# Patient Record
Sex: Male | Born: 1965 | Race: White | Hispanic: No | Smoking: Never smoker
Health system: Southern US, Community
[De-identification: ages and names within clinical notes are randomized; demographics above are authoritative.]

## PROBLEM LIST (undated history)

## (undated) DIAGNOSIS — F419 Anxiety disorder, unspecified: Secondary | ICD-10-CM

## (undated) DIAGNOSIS — K219 Gastro-esophageal reflux disease without esophagitis: Secondary | ICD-10-CM

## (undated) DIAGNOSIS — K760 Fatty (change of) liver, not elsewhere classified: Secondary | ICD-10-CM

## (undated) DIAGNOSIS — E8881 Metabolic syndrome: Secondary | ICD-10-CM

## (undated) DIAGNOSIS — G4733 Obstructive sleep apnea (adult) (pediatric): Secondary | ICD-10-CM

## (undated) DIAGNOSIS — E785 Hyperlipidemia, unspecified: Secondary | ICD-10-CM

## (undated) HISTORY — DX: Fatty (change of) liver, not elsewhere classified: K76.0

## (undated) HISTORY — DX: Hyperlipidemia, unspecified: E78.5

## (undated) HISTORY — DX: Metabolic syndrome: E88.81

## (undated) HISTORY — DX: Gastro-esophageal reflux disease without esophagitis: K21.9

## (undated) HISTORY — DX: Obstructive sleep apnea (adult) (pediatric): G47.33

## (undated) HISTORY — DX: Metabolic syndrome: E88.810

## (undated) HISTORY — DX: Anxiety disorder, unspecified: F41.9

---

## 1999-05-06 ENCOUNTER — Other Ambulatory Visit: Admission: RE | Admit: 1999-05-06 | Discharge: 1999-05-15 | Payer: Self-pay | Admitting: Psychiatry

## 2002-02-06 ENCOUNTER — Ambulatory Visit (HOSPITAL_COMMUNITY): Admission: RE | Admit: 2002-02-06 | Discharge: 2002-02-06 | Payer: Self-pay | Admitting: Gastroenterology

## 2003-02-16 ENCOUNTER — Ambulatory Visit (HOSPITAL_BASED_OUTPATIENT_CLINIC_OR_DEPARTMENT_OTHER): Admission: RE | Admit: 2003-02-16 | Discharge: 2003-02-16 | Payer: Self-pay | Admitting: Family Medicine

## 2004-05-05 ENCOUNTER — Ambulatory Visit: Payer: Self-pay | Admitting: Family Medicine

## 2004-05-13 ENCOUNTER — Ambulatory Visit: Payer: Self-pay

## 2004-07-24 ENCOUNTER — Ambulatory Visit: Payer: Self-pay | Admitting: Family Medicine

## 2004-09-24 ENCOUNTER — Ambulatory Visit: Payer: Self-pay | Admitting: Family Medicine

## 2004-09-25 ENCOUNTER — Encounter: Admission: RE | Admit: 2004-09-25 | Discharge: 2004-09-25 | Payer: Self-pay | Admitting: Family Medicine

## 2004-09-30 ENCOUNTER — Ambulatory Visit: Payer: Self-pay | Admitting: Family Medicine

## 2004-10-08 ENCOUNTER — Ambulatory Visit: Payer: Self-pay

## 2005-02-02 ENCOUNTER — Ambulatory Visit: Payer: Self-pay | Admitting: Internal Medicine

## 2005-03-09 ENCOUNTER — Ambulatory Visit: Payer: Self-pay | Admitting: Internal Medicine

## 2005-03-13 ENCOUNTER — Ambulatory Visit: Payer: Self-pay | Admitting: Internal Medicine

## 2005-05-05 ENCOUNTER — Ambulatory Visit: Payer: Self-pay | Admitting: Internal Medicine

## 2005-09-02 ENCOUNTER — Ambulatory Visit: Payer: Self-pay | Admitting: Internal Medicine

## 2005-09-14 ENCOUNTER — Ambulatory Visit: Payer: Self-pay | Admitting: Internal Medicine

## 2005-12-31 ENCOUNTER — Ambulatory Visit: Payer: Self-pay | Admitting: Internal Medicine

## 2006-03-11 ENCOUNTER — Ambulatory Visit: Payer: Self-pay | Admitting: Internal Medicine

## 2006-03-16 ENCOUNTER — Ambulatory Visit: Payer: Self-pay | Admitting: Gastroenterology

## 2006-03-29 ENCOUNTER — Ambulatory Visit: Payer: Self-pay | Admitting: Internal Medicine

## 2006-04-08 ENCOUNTER — Ambulatory Visit: Payer: Self-pay | Admitting: Internal Medicine

## 2006-04-27 ENCOUNTER — Encounter: Admission: RE | Admit: 2006-04-27 | Discharge: 2006-04-27 | Payer: Self-pay | Admitting: Gastroenterology

## 2006-09-01 ENCOUNTER — Ambulatory Visit: Payer: Self-pay | Admitting: Internal Medicine

## 2006-10-07 ENCOUNTER — Ambulatory Visit: Payer: Self-pay | Admitting: Internal Medicine

## 2006-10-07 LAB — CONVERTED CEMR LAB
Basophils Relative: 0.7 % (ref 0.0–1.0)
HCT: 44.1 % (ref 39.0–52.0)
Hemoglobin: 15.2 g/dL (ref 13.0–17.0)
Lymphocytes Relative: 28.2 % (ref 12.0–46.0)
MCHC: 34.5 g/dL (ref 30.0–36.0)
Monocytes Absolute: 0.6 10*3/uL (ref 0.2–0.7)
Monocytes Relative: 6.8 % (ref 3.0–11.0)
Neutro Abs: 5.5 10*3/uL (ref 1.4–7.7)
Neutrophils Relative %: 60.9 % (ref 43.0–77.0)
RDW: 13.1 % (ref 11.5–14.6)

## 2007-02-08 ENCOUNTER — Telehealth (INDEPENDENT_AMBULATORY_CARE_PROVIDER_SITE_OTHER): Payer: Self-pay | Admitting: *Deleted

## 2007-02-09 ENCOUNTER — Ambulatory Visit: Payer: Self-pay | Admitting: Internal Medicine

## 2008-04-09 IMAGING — CT CT ABDOMEN W/ CM
2 of 6 series · 17 of 46 positions shown, 19 images · IV contrast (READICAT/WATER & [ID] OMNI 300)
Comparison: none

CLINICAL DATA: Right upper quadrant abdominal pain intermittently for about six weeks.  Ultrasound reported increased echodensity of lower and hepatomegaly.  CT ABDOMEN AND PELVIS WITH CONTRAST:
TECHNIQUE: Multidetector CT imaging of the abdomen was performed following the standard protocol during bolus administration of intravenous contrast.
Contrast:  349cc Omnipaque 300.
CT ABDOMEN WITH CONTRAST:

[Series 102: a&p w/ · axial · 0.74mm/px · z∈[-476,-42]mm · 14 of 476 slices shown, 16 images]
[im 21/476  soft-tissue]
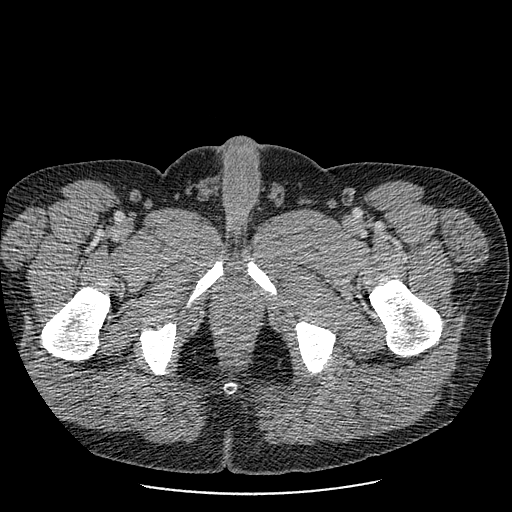
[im 21/476  bone]
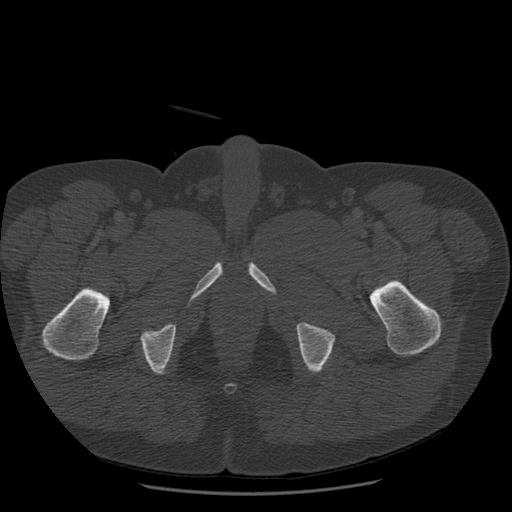
[im 62/476  soft-tissue]
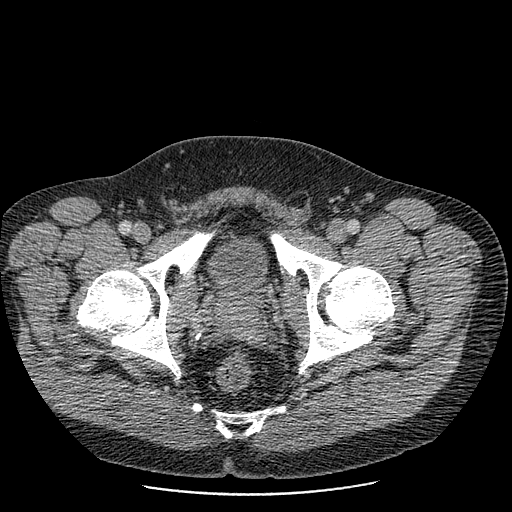
[im 83/476  soft-tissue]
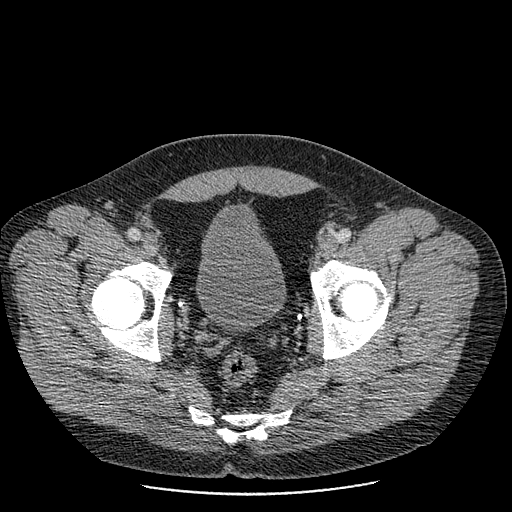
[im 124/476  soft-tissue]
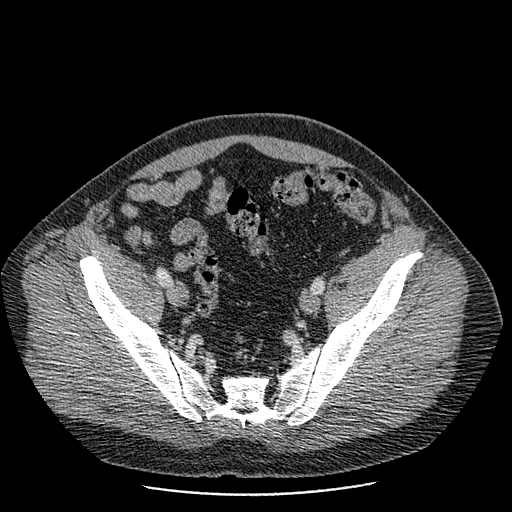
[im 166/476  soft-tissue]
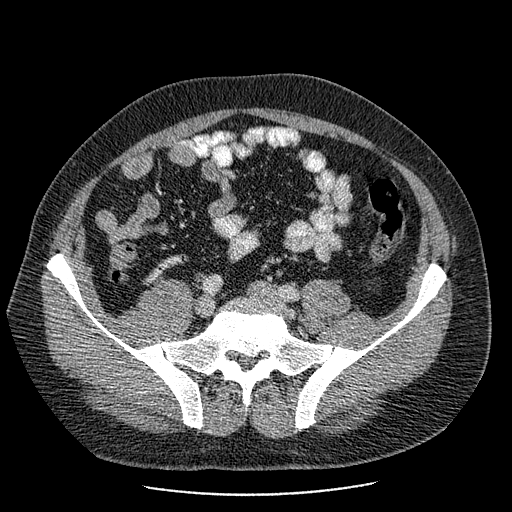
[im 186/476  soft-tissue]
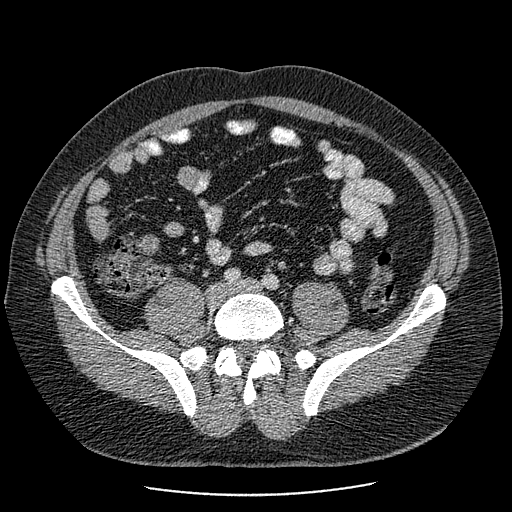
[im 228/476  soft-tissue]
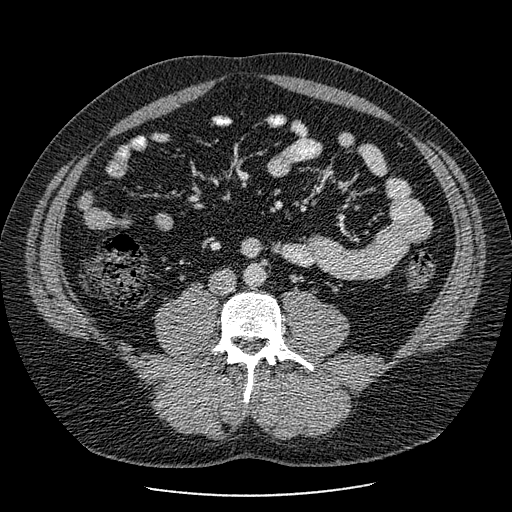
[im 248/476  soft-tissue]
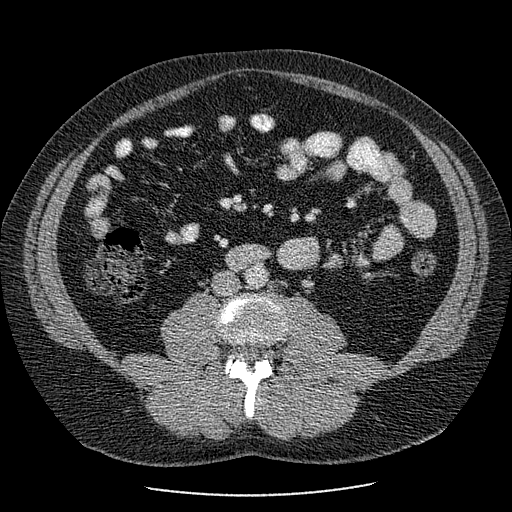
[im 290/476  soft-tissue]
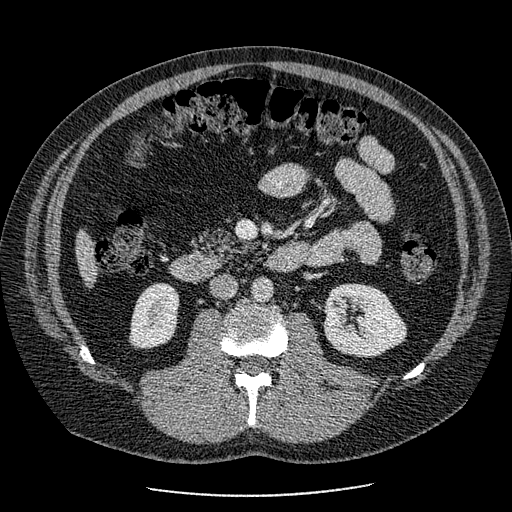
[im 290/476  bone]
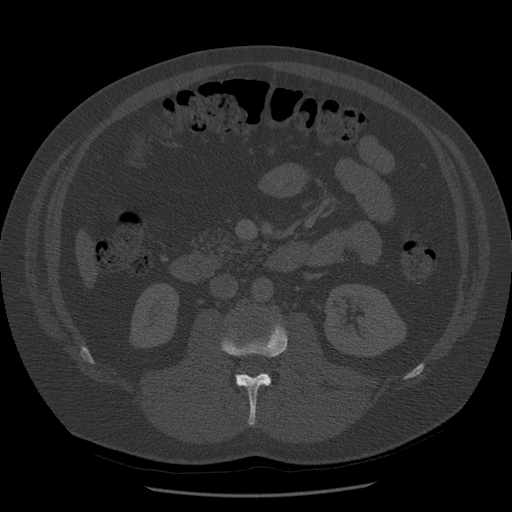
[im 310/476  soft-tissue]
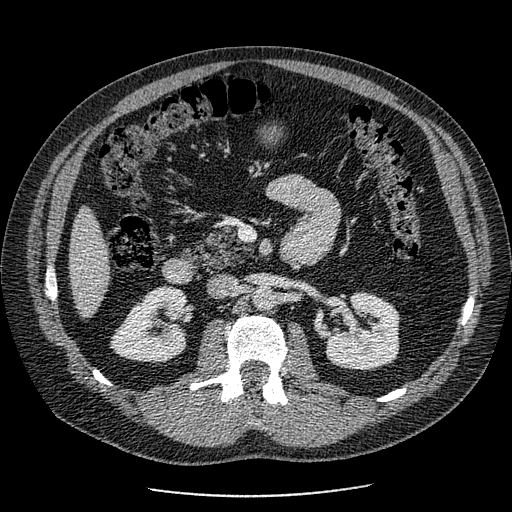
[im 352/476  soft-tissue]
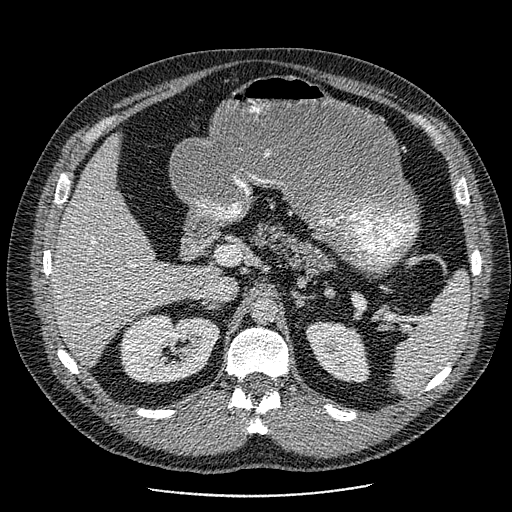
[im 393/476  soft-tissue]
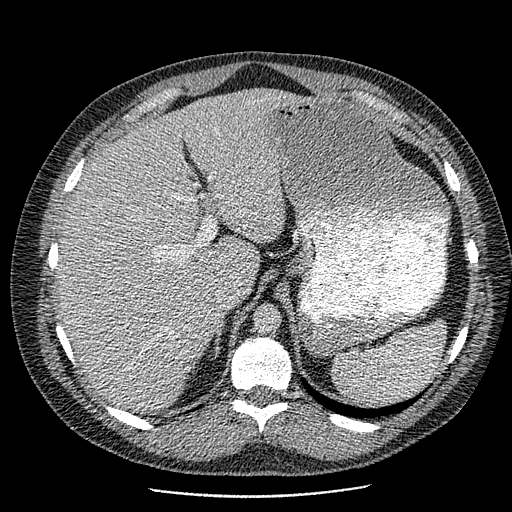
[im 414/476  soft-tissue]
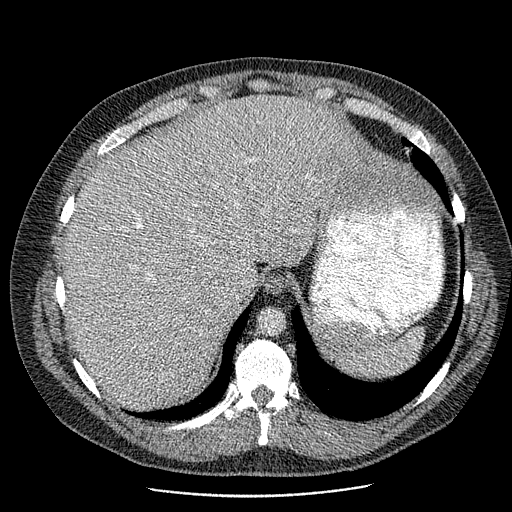
[im 455/476  soft-tissue]
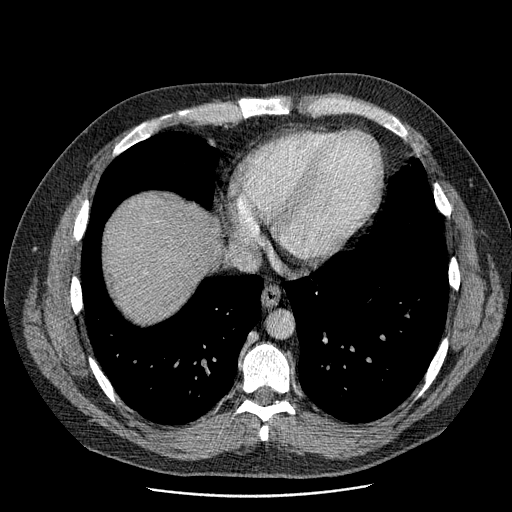

[Series 104: reformatted · coronal · 0.95mm/px · 3 of 118 slices shown]
[im 40/118  soft-tissue]
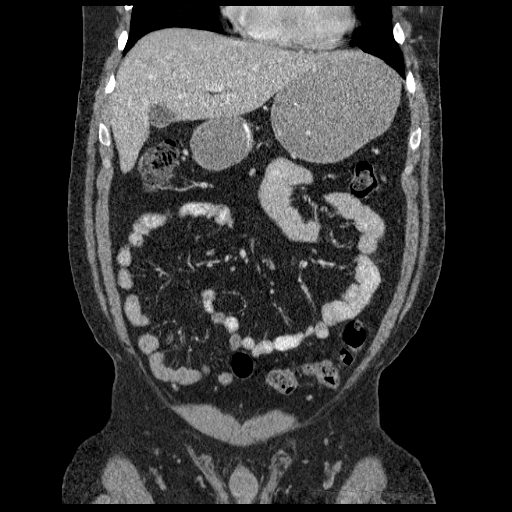
[im 53/118  soft-tissue]
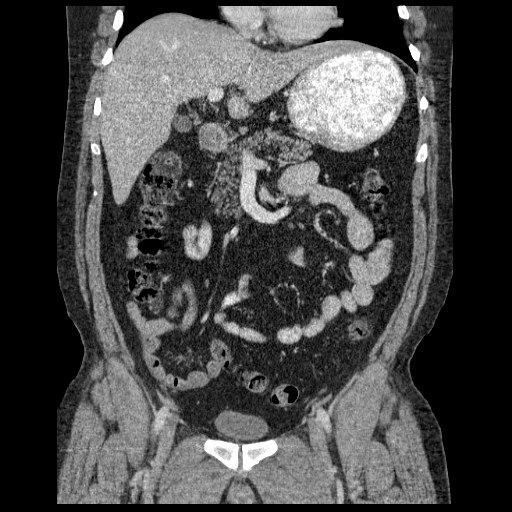
[im 66/118  soft-tissue]
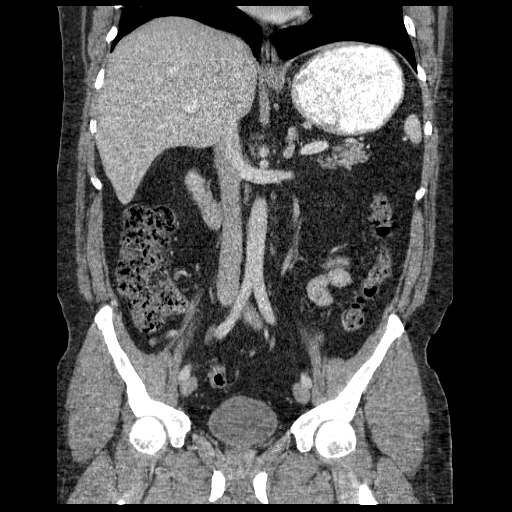

[17 of 46 positions shown; findings below may reference images not displayed]

FINDINGS: Today?s examination reveals findings compatible with mild diffuse fatty infiltration of the liver.  The liver appears slightly prominent in size.  Normal spleen, pancreas, kidneys, and adrenal glands.  The liver appears larger than noted on 09/25/04.  No biliary ductal dilatation.
IMPRESSION: Hepatomegaly.  Diffuse fatty infiltration of the liver.  
CT PELVIS WITH CONTRAST:
FINDINGS: Sigmoid colon diverticula.  Negative for diverticulitis.  No pelvic mass or abnormal fluid collection.  Prostate gland appears slightly prominent in size having a maximum transverse diameter of 4.3cm.
IMPRESSION: Sigmoid diverticular disease.  Slightly prominent in size prostate gland.

## 2008-04-30 ENCOUNTER — Ambulatory Visit: Payer: Self-pay | Admitting: Internal Medicine

## 2008-04-30 DIAGNOSIS — M542 Cervicalgia: Secondary | ICD-10-CM | POA: Insufficient documentation

## 2008-04-30 DIAGNOSIS — K219 Gastro-esophageal reflux disease without esophagitis: Secondary | ICD-10-CM

## 2008-04-30 DIAGNOSIS — E785 Hyperlipidemia, unspecified: Secondary | ICD-10-CM

## 2008-04-30 DIAGNOSIS — F411 Generalized anxiety disorder: Secondary | ICD-10-CM | POA: Insufficient documentation

## 2008-04-30 DIAGNOSIS — G4733 Obstructive sleep apnea (adult) (pediatric): Secondary | ICD-10-CM

## 2008-05-01 ENCOUNTER — Encounter: Payer: Self-pay | Admitting: Internal Medicine

## 2008-05-09 ENCOUNTER — Telehealth: Payer: Self-pay | Admitting: Internal Medicine

## 2008-06-06 ENCOUNTER — Encounter: Payer: Self-pay | Admitting: Internal Medicine

## 2008-06-29 HISTORY — PX: CERVICAL DISCECTOMY: SHX98

## 2008-07-24 ENCOUNTER — Ambulatory Visit (HOSPITAL_COMMUNITY): Admission: RE | Admit: 2008-07-24 | Discharge: 2008-07-24 | Payer: Self-pay | Admitting: Neurosurgery

## 2008-08-13 ENCOUNTER — Ambulatory Visit: Payer: Self-pay | Admitting: Internal Medicine

## 2008-08-13 LAB — CONVERTED CEMR LAB
ALT: 36 units/L (ref 0–53)
AST: 19 units/L (ref 0–37)
BUN: 15 mg/dL (ref 6–23)
Basophils Absolute: 0 10*3/uL (ref 0.0–0.1)
Basophils Relative: 0.6 % (ref 0.0–3.0)
CO2: 29 meq/L (ref 19–32)
Chloride: 104 meq/L (ref 96–112)
Creatinine, Ser: 0.9 mg/dL (ref 0.4–1.5)
Direct LDL: 206.6 mg/dL
Eosinophils Relative: 2.7 % (ref 0.0–5.0)
Glucose, Bld: 87 mg/dL (ref 70–99)
HDL: 35.3 mg/dL — ABNORMAL LOW (ref >39.0)
Hemoglobin: 15.5 g/dL (ref 13.0–17.0)
Lymphocytes Relative: 35.8 % (ref 12.0–46.0)
Monocytes Relative: 6.9 % (ref 3.0–12.0)
Neutro Abs: 3.7 10*3/uL (ref 1.4–7.7)
Neutrophils Relative %: 54 % (ref 43.0–77.0)
RBC: 5.2 M/uL (ref 4.22–5.81)
Total Bilirubin: 0.8 mg/dL (ref 0.3–1.2)
Total Protein: 7.2 g/dL (ref 6.0–8.3)
VLDL: 41 mg/dL — ABNORMAL HIGH (ref 0–40)
WBC: 6.9 10*3/uL (ref 4.5–10.5)

## 2008-08-15 ENCOUNTER — Encounter: Payer: Self-pay | Admitting: Internal Medicine

## 2008-08-16 ENCOUNTER — Ambulatory Visit: Payer: Self-pay | Admitting: Internal Medicine

## 2008-10-04 ENCOUNTER — Telehealth (INDEPENDENT_AMBULATORY_CARE_PROVIDER_SITE_OTHER): Payer: Self-pay | Admitting: *Deleted

## 2008-10-17 ENCOUNTER — Encounter: Payer: Self-pay | Admitting: Internal Medicine

## 2008-12-18 ENCOUNTER — Ambulatory Visit: Payer: Self-pay | Admitting: Internal Medicine

## 2008-12-24 LAB — CONVERTED CEMR LAB
Direct LDL: 195.7 mg/dL
HDL: 40.2 mg/dL (ref >39.00)

## 2009-02-04 ENCOUNTER — Ambulatory Visit: Payer: Self-pay | Admitting: Internal Medicine

## 2009-02-05 ENCOUNTER — Encounter: Admission: RE | Admit: 2009-02-05 | Discharge: 2009-02-05 | Payer: Self-pay | Admitting: Internal Medicine

## 2009-04-18 ENCOUNTER — Encounter: Payer: Self-pay | Admitting: Internal Medicine

## 2009-04-29 ENCOUNTER — Encounter: Payer: Self-pay | Admitting: Internal Medicine

## 2009-05-07 ENCOUNTER — Encounter: Payer: Self-pay | Admitting: Internal Medicine

## 2009-05-16 ENCOUNTER — Encounter: Payer: Self-pay | Admitting: Internal Medicine

## 2010-03-24 ENCOUNTER — Telehealth (INDEPENDENT_AMBULATORY_CARE_PROVIDER_SITE_OTHER): Payer: Self-pay | Admitting: *Deleted

## 2010-04-09 ENCOUNTER — Ambulatory Visit: Payer: Self-pay | Admitting: Internal Medicine

## 2010-04-09 ENCOUNTER — Encounter: Payer: Self-pay | Admitting: Internal Medicine

## 2010-06-18 ENCOUNTER — Ambulatory Visit: Payer: Self-pay | Admitting: Family Medicine

## 2010-06-18 ENCOUNTER — Ambulatory Visit: Payer: Self-pay | Admitting: Internal Medicine

## 2010-06-18 DIAGNOSIS — H6690 Otitis media, unspecified, unspecified ear: Secondary | ICD-10-CM | POA: Insufficient documentation

## 2010-06-18 DIAGNOSIS — J019 Acute sinusitis, unspecified: Secondary | ICD-10-CM

## 2010-06-24 LAB — CONVERTED CEMR LAB
CO2: 31 meq/L (ref 19–32)
Calcium: 9.8 mg/dL (ref 8.4–10.5)
Cholesterol: 266 mg/dL — ABNORMAL HIGH (ref 0–200)
Creatinine, Ser: 1 mg/dL (ref 0.4–1.5)
Eosinophils Relative: 7.2 % — ABNORMAL HIGH (ref 0.0–5.0)
Glucose, Bld: 69 mg/dL — ABNORMAL LOW (ref 70–99)
HCT: 44.9 % (ref 39.0–52.0)
Hemoglobin: 15.6 g/dL (ref 13.0–17.0)
Lymphs Abs: 2.9 10*3/uL (ref 0.7–4.0)
Monocytes Relative: 6 % (ref 3.0–12.0)
Neutro Abs: 4.6 10*3/uL (ref 1.4–7.7)
RDW: 13.1 % (ref 11.5–14.6)
TSH: 2.36 microintl units/mL (ref 0.35–5.50)
Triglycerides: 190 mg/dL — ABNORMAL HIGH (ref 0.0–149.0)
VLDL: 38 mg/dL (ref 0.0–40.0)
WBC: 8.7 10*3/uL (ref 4.5–10.5)

## 2010-06-25 ENCOUNTER — Telehealth: Payer: Self-pay | Admitting: Family Medicine

## 2010-07-29 NOTE — Progress Notes (Signed)
Summary: SERTRALINE REFILL--HE IS OUT--HAS CPX APPT  Phone Note Call from Patient Call back at [phone]   Caller: Patient Summary of Call: HAS APPOINTMENT FOR PHYSICAL ON 10/12 AT 3:00---  PLEASE CALL IN REFILL [facility] [address]--HE IS OUT OF MEDICATION  Initial call taken by: Jerolyn Shin,  March 24, 2010 11:24 AM    Prescriptions: SERTRALINE HCL 100 MG TABS (SERTRALINE HCL) 1 1/2 by mouth qd  #45 x 0   Entered by:   Army Fossa CMA   Authorized by:   Nolon Rod. Paz MD   Signed by:   Army Fossa CMA on 03/24/2010   Method used:   Electronically to        [facility] [phone]* (retail)       [address]       [place], [place]  [address]       Ph: [phone]       Fax: [phone]   RxID:   [id number]

## 2010-07-29 NOTE — Assessment & Plan Note (Signed)
Summary: CPX///SPH   Vital Signs:  Patient profile:   45 year old male Height:      73 inches Weight:      235.13 pounds BMI:     31.13 Pulse rate:   90 / minute Pulse rhythm:   regular BP sitting:   120 / 82  (left arm) Cuff size:   large  Vitals Entered By: Army Fossa CMA (April 09, 2010 3:12 PM) CC: CPX, not fasting, no complaints Comments Discuss Sertraline flu shot?--allergic to poultry Pharm- [facility]    History of Present Illness: CPX, not fasting   Discuss Sertraline-- see a/p   flu shot?--allergic to poultry-- see a/p   Preventive Screening-Counseling & Management  Alcohol-Tobacco     Smoking Status: current     Cans of tobacco/week: yes  Caffeine-Diet-Exercise     Does Patient Exercise: yes     Type of exercise: soccer coach  Current Medications (verified): 1)  Sertraline Hcl 100 Mg Tabs (Sertraline Hcl) .Marland Kitchen.. 1 1/2 By Mouth Qd  Allergies (verified): 1)  ! * Poultry  Past History:  Past Medical History: Reviewed history from 08/16/2008 and no changes required. Hyperlipidemia OSA-- uses a CPAP, Dr Marcos Eke  Fatty liver  (CT , U/S 2007) Dx at some point w/  metabolic syndrome Anxiety (Panic d/o) GERD s/p several EGD per patient, s/p dilatations GI Dr Randa Evens  Past Surgical History: Reviewed history from 08/16/2008 and no changes required. diskectomy - 06/2008  Social History: married 1 child  and 2 step children tobacco--no ETOH-- rarely works for [employer]-  regular ,  + moderation execise-- still plays soccer, Engineer, maintenance as well Smoking Status:  current Does Patient Exercise:  yes  Review of Systems CV:  Denies chest pain or discomfort and swelling of feet. Resp:  Denies cough and shortness of breath. GI:  Denies bloody stools, nausea, and vomiting. GU:  Denies dysuria, hematuria, urinary frequency, and urinary hesitancy; noted a bump in the R testicle 6 months ago, 1-2 mm, no pain, no growth in the last few months  .  Physical Exam  General:  alert, well-developed, and overweight-appearing.   Neck:  no masses and no thyromegaly.   Lungs:  normal respiratory effort, no intercostal retractions, no accessory muscle use, and normal breath sounds.   Heart:  normal rate, regular rhythm, no murmur, and no gallop.   Abdomen:  soft, non-tender, normal bowel sounds, no distention, no masses, no guarding, and no rigidity.   Genitalia:  circumcised, no cutaneous lesions, and no urethral discharge.  patient pointed to the " lump" in the right testicle, on palpation that seems to be part of the epididymis and is indeed about 2 mm in size . The right testicle is slightly larger than the left but otherwise normal Extremities:  no edema Psych:  Oriented X3, memory intact for recent and remote, normally interactive, good eye contact, not anxious appearing, and not depressed appearing.     Impression & Recommendations:  Problem # 1:  HEALTH SCREENING (ICD-V70.0) Td 07-2008 flu shot? 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 We'll hold off on the flu shot at this point  EKG normal sinus rhythm Diet and exercise discussed labs  Orders: EKG w/ Interpretation (93000)  Problem # 2:  ANXIETY (ICD-300.00) as far as anxiety, he has tried several medicines before, he finally felt really well w/  sertraline which he has been taking for a while. He came down from  sertraline 150 mg daily  to his current dose of 100 mg a day. Wonders if he could stop or continue decreasing the dose. We agreed to decrease dose to  75 mg daily and see how he does. We'll keep him on that dose for one year and reassess in 2012. Of course if he needs to go back to 100 mg he should do that. see instructions  His updated medication list for this problem includes:    Sertraline Hcl 50 Mg Tabs (Sertraline hcl) .Marland Kitchen... 2 tablets daily     Problem # 3:  ? of TESTICULAR MASS (ICD-608.89) see physical exam, to be sure we'll check  a  ultrasound ADDENDUM: Patient declined "If anything is getting smaller I prefer to wait" We agreed that he will call me if the area gets harder or increase in size  Complete Medication List: 1)  Sertraline Hcl 50 Mg Tabs (Sertraline hcl) .... 2 tablets daily  Patient Instructions: 1)  came back fasting -- FLP, AST, ALT, TSH, BMP, CBC....dx V70 2)  take 1.5 tablets of sertraline 50 mg daily 3)  Please schedule a follow-up appointment in 6 months .  Prescriptions: SERTRALINE HCL 50 MG TABS (SERTRALINE HCL) 2 tablets daily  #60 x 6   Entered and Authorized by:   Nolon Rod. Emrys Mckamie MD   Signed by:   Nolon Rod. Hoby Kawai MD on 04/09/2010   Method used:   Electronically to        [facility] [phone]* (retail)       [address]       [place], [place]  [address]       Ph: [phone]       Fax: [phone]   RxID:   [id number]    Risk Factors:  Tobacco use:  current    Smokeless:  Yes -- 1/2 can per day Exercise:  yes    Type:  soccer coach

## 2010-07-31 NOTE — Assessment & Plan Note (Signed)
Summary: FOR SINUS//PH   Vital Signs:  Patient profile:   45 year old male Weight:      234 pounds BMI:     30.98 Temp:     99.0 degrees F oral BP sitting:   114 / 70  (left arm)  Vitals Entered By: Doristine Devoid CMA (June 18, 2010 11:22 AM) CC: sinus congestion along w/ swellen and pressure    History of Present Illness: 45 yo man here today for sinus congestion.  sxs started 3 weeks w/ 'what i presume was the flu'.  head pressure initially improved but 'it never went away'.  + nasal congestion.  no fevers.  no tooth pain.  no known sick contacts.  no ear pain but has congestion.  no cough.  using sudafed and benadryl- benadryl w/ more relief.  has live christmas tree.  + sneezing.  Current Medications (verified): 1)  Sertraline Hcl 50 Mg Tabs (Sertraline Hcl) .... 2 Tablets Daily  Allergies (verified): 1)  ! * Poultry  Review of Systems      See HPI  Physical Exam  General:  alert, well-developed, and overweight-appearing.   Head:  + TTP over maxillary sinuses Eyes:  no injxn or inflammation Ears:  R TM dull, erythematous, poor landmarks L TM normal Nose:  + congestion Mouth:  + PND Neck:  shotty anterior LAD Lungs:  normal respiratory effort, no intercostal retractions, no accessory muscle use, and normal breath sounds.   Heart:  normal rate, regular rhythm, no murmur, and no gallop.     Impression & Recommendations:  Problem # 1:  UNSPECIFIED OTITIS MEDIA (ICD-382.9) Assessment New R ear infxn.  start amox.  reviewed supportive care and red flags that should prompt return.  Pt expresses understanding and is in agreement w/ this plan. His updated medication list for this problem includes:    Amoxicillin 500 Mg Caps (Amoxicillin) .Marland Kitchen... 2 pills two times a day x10 days.  take w/ food.  Problem # 2:  SINUSITIS - ACUTE-NOS (ICD-461.9) Assessment: New start amox.  add daily antihistamine to decrease nasal congestion and allow sinus drainage.  reviewed supportive  care and red flags that should prompt return.  Pt expresses understanding and is in agreement w/ this plan. His updated medication list for this problem includes:    Amoxicillin 500 Mg Caps (Amoxicillin) .Marland Kitchen... 2 pills two times a day x10 days.  take w/ food.  Complete Medication List: 1)  Sertraline Hcl 50 Mg Tabs (Sertraline hcl) .... 2 tablets daily 2)  Amoxicillin 500 Mg Caps (Amoxicillin) .... 2 pills two times a day x10 days.  take w/ food.  Patient Instructions: 1)  You have a right ear infection and likely sinus infection 2)  Start the Amoxicillin as directed- take w/ food to avoid upset stomach 3)  Continue the Zyrtec for the allergy component- can add Benadryl as needed. 4)  Mucinex as needed to thin your congestion 5)  Drink plenty of fluid 6)  Rest! 7)  Hang in there! 8)  Happy Holidays! Prescriptions: AMOXICILLIN 500 MG CAPS (AMOXICILLIN) 2 pills two times a day x10 days.  take w/ food.  #40 x 0   Entered and Authorized by:   Neena Rhymes MD   Signed by:   Neena Rhymes MD on 06/18/2010   Method used:   Electronically to        [facility] [phone]* (retail)       [address]  [place], [place]  [address]       Ph: [phone]       Fax: [phone]   RxID:   [id number]    Orders Added: 1)  Est. Patient Level III [84132]

## 2010-07-31 NOTE — Progress Notes (Signed)
Summary: Still feeling sick  Phone Note Call from Patient   Caller: Patient Call For: DR.TABORI Details for Reason: Still sick Summary of Call: call from patient who stated he has been on Amox for 1 week for a sinus and ear infection and he does not feel better. Per patient his ear feels good, but he is still having sinus pressure, post nasal drainage, green mucus, congestion and sneezing. He stated the antihistamine is not helping the sneezing. At this time he is not having a fever or cough. Wants to know if he needs anothe med or if he needs to be seen again this week c/b# [phone].Marland KitchenMarland Kitchenplease advise Initial call taken by: Almeta Monas CMA Duncan Dull),  June 25, 2010 4:07 PM  Follow-up for Phone Call        can switch to Avelox 400mg  daily x10 days.  take w/ food.  this is a very strong abx and should definitely take care of his sxs Follow-up by: Neena Rhymes MD,  June 25, 2010 4:13 PM    New/Updated Medications: AVELOX 400 MG TABS (MOXIFLOXACIN HCL) 1 by mouth once daily x 10 days Prescriptions: AVELOX 400 MG TABS (MOXIFLOXACIN HCL) 1 by mouth once daily x 10 days  #10 x 0   Entered by:   Almeta Monas CMA (AAMA)   Authorized by:   Neena Rhymes MD   Signed by:   Almeta Monas CMA (AAMA) on 06/25/2010   Method used:   Faxed to ...       [facility] [phone]* (retail)       [address]       [place], [place]  [address]       Ph: [phone]       Fax: [phone]   RxID:   [id number]

## 2010-09-18 ENCOUNTER — Encounter: Payer: Self-pay | Admitting: Internal Medicine

## 2010-09-18 ENCOUNTER — Ambulatory Visit (INDEPENDENT_AMBULATORY_CARE_PROVIDER_SITE_OTHER): Payer: Managed Care, Other (non HMO) | Admitting: Internal Medicine

## 2010-09-18 VITALS — BP 130/82 | HR 89 | Temp 99.3°F | Wt 235.4 lb

## 2010-09-18 DIAGNOSIS — J029 Acute pharyngitis, unspecified: Secondary | ICD-10-CM | POA: Insufficient documentation

## 2010-09-18 MED ORDER — AMOXICILLIN 500 MG PO CAPS: 1000.0000 mg | ORAL_CAPSULE | Freq: Two times a day (BID) | ORAL | Status: AC

## 2010-09-18 NOTE — Progress Notes (Signed)
  Subjective:    Patient ID: Earl Lee, male    DOB: June 23, 1966, 45 y.o.   MRN: [id number]  HPI  symptoms started 2 days ago, reports severe sore throat , over-the-counter Zyrtec has not helped  Past Medical History  Diagnosis Date  . Hyperlipidemia   . OSA (obstructive sleep apnea)     uses CPAP  . Fatty liver     CT, U/S 2007  . Metabolic syndrome   . Anxiety     panic d/o  . GERD (gastroesophageal reflux disease)     s/p several EGD per pt, s/o dilations GI Dr.Edwards     Review of Systems  denies fevers No nausea or vomiting No cough or sinus congestion. No chest congestion  GERD symptoms well-controlled  has not been in contact with sick individuals    Objective:   Physical Exam  Constitutional: He appears well-developed and well-nourished.  HENT:  Head: Normocephalic and atraumatic.  Left Ear: External ear normal.        Throat is moderately red. Tonsils are small,  pharynx wall symmetric, Uvula  Midline. No white patches.  right tympanic membrane slightly bulging, no red  Cardiovascular: Normal rate, regular rhythm and normal heart sounds.   Pulmonary/Chest: Effort normal and breath sounds normal.          Assessment & Plan:

## 2010-09-18 NOTE — Patient Instructions (Signed)
Amoxicillin x 10 days Fluids, tylenol, advil Call if sx increase, severe or no better in few days

## 2010-09-18 NOTE — Assessment & Plan Note (Addendum)
Severe ST x 2 days, exam benign Viral Vs strep infx Rapid strep neg Plan:  abx, throat Cx D/c abx if Cx neg

## 2010-10-13 LAB — CBC
HCT: 46.8 % (ref 39.0–52.0)
Hemoglobin: 15.7 g/dL (ref 13.0–17.0)
MCHC: 33.5 g/dL (ref 30.0–36.0)
MCV: 86.3 fL (ref 78.0–100.0)
RDW: 13.6 % (ref 11.5–15.5)

## 2010-11-11 NOTE — Op Note (Signed)
[name], [name]                ACCOUNT NO.:  [id number]   MEDICAL RECORD NO.:  [id number]          PATIENT TYPE:  OIB   LOCATION:  3535                         FACILITY:  [facility]   PHYSICIAN:  Danae Orleans. Venetia Maxon, M.D.  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   PROCEDURE:  Left C6-7 sitting METRx cervical microdiskectomy with  microdissection.   SURGEON:  Danae Orleans. Venetia Maxon, MD   ASSISTANT:  Dr. Lovell Sheehan.   ANESTHESIA:  General endotracheal anesthesia.   ESTIMATED BLOOD LOSS:  Minimal.   COMPLICATIONS:  None.   DISPOSITION:  Recovery.   INDICATIONS:  Earl Lee is a 45 year old man with a lateral disk  herniation at C6-7 on the left with left C7 radiculopathy.  He has  significant spondylosis at C5-6 and C7-T1 levels.  It was elected to  take him to surgery for a sitting cervical microdiskectomy.   PROCEDURE:  Mr. Earl Lee was brought to the operating room.  Following  satisfactory and uncomplicated induction of general endotracheal  anesthesia and placement of central venous access catheter, he was  placed in 3 pinhead fixation and placed in a sitting position.  His neck  was maintained in a neutral alignment.  His posterior neck was then  shaved, prepped, and draped in the usual sterile fashion.  Using C-arm  fluoroscopy, the C6-7 interspace was identified.  Using the METRx  tubular retractor system, the tubular retractors were placed on the left  side after a skin and fascial incision was made and sequential dilators  were used to expose the C6-7 facet joint complex and interlaminar window  on the left.  A 7 cm x 22 mm  retractor was locked down and its correct  positioning was confirmed on the lateral C-arm fluoroscopy.  Small  amount of overlying muscle was then cauterized exposing the facet joint  complex and interlaminar window.  A high-speed drill was used with a  small matchstick bur to remove the inferomedial aspect of the C6 lamina  and facet joint and the superomedial aspect of the C7 facet joint and  lamina.  The lateral edge of the spinal cord dura was decompressed as  was the C7 nerve root as extended out the neural foramen.  Using  microdissection technique with the operating microscope, the C7 nerve  root was then mobilized and herniated disk material was identified below  the takeoff of the nerve root.  This was fairly rubbery disk herniation.  I was not able to remove a discrete single fragment, but was able to  remove a broad herniation with decompression of the nerve root.  It was  felt that the nerve root was then well decompressed.  Dr. Lovell Sheehan also  explored the decompression and confirmed this did not appear to be any  residual fragments  of herniated disk material.  The wound was irrigated.  The self-retaining METRx retractor was removed.  The posterior cervical  fascia was closed with 2-0 Vicryl sutures, the skin edges were  approximated with 3-0 Vicryl subcuticular stitch, and the wound was  dressed with Dermabond.  The patient was then taken out of the 3 pinhead  fixation, returned to the supine position, extubated in the operating  room, and taken to recovery in stable satisfactory condition having  tolerated the operation well.  Counts were correct at the end of the  case.      Danae Orleans. Venetia Maxon, M.D.  Electronically Signed     JDS/MEDQ  D:  07/24/2008  T:  07/24/2008  Job:  [id number]

## 2010-11-14 NOTE — Letter (Signed)
April 08, 2006    Fayrene Fearing L. Randa Evens, M.D.  [address]  [place], [place] [address]   RE:  AARYAV, HOPFENSPERGER  MRN:  [id number]  /  DOB:  [birth year]   Dear Dr. Randa Evens:   This letter is in reference to one of our patients Earl Lee.  As you  recall he is a gentleman with gastroesophageal reflux disease esophageal  stricture seen on EGD back in 1997.  A sleep apnea and metabolic syndrome.  He presented to the office a few weeks ago with right upper quadrant pain.  This prompted ultrasound which show increase hepatic echodensity on  borderline hepatomegaly and the report also state that we could consider a  CT scan of the liver.  I saw him today on followup. The right upper quadrant  discomfort is slightly better.  On further questioning, he does still have a  feeling of regurgitation on and off despite daily proton pump inhibitors.   The reason for the referral is to sort out the need of further imaging on  the liver and also the possibility of repeat endoscopy.  I also wonder if  the right upper quadrant discomfort is related to the abnormal liver.  I am  sending out LFTs, amylase, lipase along with other studies including a  fasting lipid profile.  He may need to be on cholesterol medication.  If you  have any questions regarding this nice gentleman, please do not hesitate to  call me.   Sincerely,     Willow Ora, MD    JP/MedQ  /  Job #:  [id number]  DD:  04/08/2006 / DT:  04/09/2006

## 2011-03-06 ENCOUNTER — Telehealth: Payer: Self-pay

## 2011-03-06 MED ORDER — CLONAZEPAM 0.5 MG PO TABS: 0.5000 mg | ORAL_TABLET | Freq: Two times a day (BID) | ORAL | Status: DC

## 2011-03-06 MED ORDER — SERTRALINE HCL 100 MG PO TABS: ORAL_TABLET | ORAL | Status: DC

## 2011-03-06 NOTE — Telephone Encounter (Signed)
Patient called triage line indicating that he is going through so much (teenage crisis and martial crisis) patient is taking sertraline 50mg  tab 1 1/2 by mouth daily (he was trying to wean himself off med).  Patient would like to know if he can increase sertraline and have something in addition (short term use) to help him cope with his current circumstances. [facility].   Dr.Paz please advise and forward to triage Sunny Schlein)

## 2011-03-06 NOTE — Telephone Encounter (Signed)
Change from sertraline 75 mg to  sertraline 100 mg tablet: 1 tablet a day for one week, then 1.5 tablet daily. Call new rx  Call also clonazepam 0.5 mg 1 by mouth twice a day when necessary for anxiety, #40, no refills. Advised patient it will cause drowsiness. Followup in 3 weeks

## 2011-03-06 NOTE — Telephone Encounter (Signed)
Rx[s] Done. LMOM to inform Pt.

## 2011-04-25 ENCOUNTER — Other Ambulatory Visit: Payer: Self-pay | Admitting: Internal Medicine

## 2011-04-27 ENCOUNTER — Other Ambulatory Visit: Payer: Self-pay

## 2011-04-27 NOTE — Telephone Encounter (Signed)
Patient called to update his pharmacy, [facility] is now [facility], changed. Patient questioned if rx was filled for Zoloft, I reviewed chart, rx was filled today at [facility]

## 2011-04-27 NOTE — Telephone Encounter (Signed)
Last OV 09/18/10. Last filled 03/06/11

## 2011-04-29 NOTE — Telephone Encounter (Signed)
Advise pt:  will refill 1 month supply, no further refills without OV

## 2011-06-11 ENCOUNTER — Other Ambulatory Visit: Payer: Self-pay | Admitting: Internal Medicine

## 2011-06-11 NOTE — Telephone Encounter (Signed)
Last filled 04-25-11 #40, last ov 09-18-10 see note on refill

## 2011-06-11 NOTE — Telephone Encounter (Signed)
Call 1 month supply

## 2011-06-12 MED ORDER — SERTRALINE HCL 100 MG PO TABS: ORAL_TABLET | ORAL | Status: DC

## 2011-06-12 NOTE — Telephone Encounter (Signed)
Rx sent to pharmacy, Pt aware.

## 2011-06-15 ENCOUNTER — Telehealth: Payer: Self-pay | Admitting: Internal Medicine

## 2011-06-16 MED ORDER — SERTRALINE HCL 100 MG PO TABS: ORAL_TABLET | ORAL | Status: DC

## 2011-06-16 NOTE — Telephone Encounter (Signed)
Rx resent.

## 2011-06-17 ENCOUNTER — Ambulatory Visit (INDEPENDENT_AMBULATORY_CARE_PROVIDER_SITE_OTHER): Payer: Managed Care, Other (non HMO) | Admitting: Internal Medicine

## 2011-06-17 DIAGNOSIS — E785 Hyperlipidemia, unspecified: Secondary | ICD-10-CM

## 2011-06-17 DIAGNOSIS — K219 Gastro-esophageal reflux disease without esophagitis: Secondary | ICD-10-CM

## 2011-06-17 DIAGNOSIS — F411 Generalized anxiety disorder: Secondary | ICD-10-CM

## 2011-06-17 DIAGNOSIS — Z0000 Encounter for general adult medical examination without abnormal findings: Secondary | ICD-10-CM

## 2011-06-17 LAB — POCT URINALYSIS DIPSTICK
Bilirubin, UA: NEGATIVE
Leukocytes, UA: NEGATIVE
Nitrite, UA: NEGATIVE
Protein, UA: NEGATIVE
Urobilinogen, UA: 0.2
pH, UA: 5

## 2011-06-17 LAB — TSH: TSH: 1.73 u[IU]/mL (ref 0.35–5.50)

## 2011-06-17 LAB — LIPID PANEL
HDL: 45.2 mg/dL (ref >39.00)
Triglycerides: 352 mg/dL — ABNORMAL HIGH (ref 0.0–149.0)

## 2011-06-17 LAB — COMPREHENSIVE METABOLIC PANEL
AST: 25 U/L (ref 0–37)
Alkaline Phosphatase: 67 U/L (ref 39–117)
BUN: 19 mg/dL (ref 6–23)
Calcium: 9.5 mg/dL (ref 8.4–10.5)
Creatinine, Ser: 0.9 mg/dL (ref 0.4–1.5)

## 2011-06-17 LAB — CBC WITH DIFFERENTIAL/PLATELET
Basophils Absolute: 0 10*3/uL (ref 0.0–0.1)
Eosinophils Absolute: 0.2 10*3/uL (ref 0.0–0.7)
HCT: 43.5 % (ref 39.0–52.0)
Lymphs Abs: 2.3 10*3/uL (ref 0.7–4.0)
Monocytes Absolute: 0.4 10*3/uL (ref 0.1–1.0)
Monocytes Relative: 5.5 % (ref 3.0–12.0)
Platelets: 208 10*3/uL (ref 150.0–400.0)
RDW: 13.4 % (ref 11.5–14.6)

## 2011-06-17 MED ORDER — SERTRALINE HCL 100 MG PO TABS: 100.0000 mg | ORAL_TABLET | Freq: Every day | ORAL | Status: DC

## 2011-06-17 NOTE — Assessment & Plan Note (Signed)
Base on last FLP I rec statins, did not try them although he recalls taking pravachol few years ago. He is not convinced needs to treat the high cholesterol, i discussed the risks: MI stroke Labs

## 2011-06-17 NOTE — Progress Notes (Signed)
  Subjective:    Patient ID: Earl Lee, male    DOB: 07-10-1965, 45 y.o.   MRN: [id number]  HPI CPX  Past Medical History: Hyperlipidemia OSA-- uses a CPAP  Fatty liver  (CT , U/S 2007) Dx at some point w/  metabolic syndrome Anxiety (Panic d/o) GERD s/p several EGD per patient, s/p dilatations GI Dr Randa Evens  Past Surgical History: diskectomy - 06/2008  Social History: Married, separated , daughter lives w/ him now 1 child  and 2 step children tobacco--no ETOH-- rarely works for [employer]-- slt better than last year  execise-- used to play soccer,active in the yard, walks sometimes   Family History DM-- no CAD-- M MI age 66 (h/o lupus) Strokes--no Lupus-- M Colon ca--no Prostate ca--no   Review of Systems GERD sx well controlled w/ PRN PPIs Anxiety well controlled, uses klonopin very rarely  No  CP-SOB No N V D blood in stools STE is now normal Had dysuria one time 2 months ago, no further problems No gross hematuria     Objective:   Physical Exam  Constitutional: He is oriented to person, place, and time. He appears well-developed. No distress.       Central obesity  Neck: No thyromegaly present.       Nl carotid pulse   Cardiovascular: Normal rate, regular rhythm and normal heart sounds.   No murmur heard. Pulmonary/Chest: Effort normal and breath sounds normal. No respiratory distress. He has no wheezes. He has no rales.  Abdominal: Soft. Bowel sounds are normal. He exhibits no distension. There is no tenderness. There is no rebound and no guarding.  Musculoskeletal: He exhibits no edema.  Neurological: He is alert and oriented to person, place, and time.  Skin: He is not diaphoretic.  Psychiatric: He has a normal mood and affect. His behavior is normal. Judgment and thought content normal.       Assessment & Plan:

## 2011-06-17 NOTE — Assessment & Plan Note (Addendum)
Td 07-2008 flu shot? 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 We'll hold off on the flu shot at this point. Discussed possible referral to an allergist to determine if he can get his shots safely, declined  Diet and exercise discussed labs Will get me a form to fill (waist measured 46 inch)

## 2011-06-17 NOTE — Assessment & Plan Note (Signed)
Well controled w/ prilosec prn

## 2011-06-17 NOTE — Patient Instructions (Signed)
Continue doing self testicular exam

## 2011-06-17 NOTE — Assessment & Plan Note (Signed)
Sx well controlled  

## 2011-06-18 ENCOUNTER — Encounter: Payer: Self-pay | Admitting: Internal Medicine

## 2011-06-22 ENCOUNTER — Other Ambulatory Visit: Payer: Self-pay | Admitting: *Deleted

## 2011-06-22 LAB — HEMOGLOBIN A1C: Hgb A1c MFr Bld: 5.7 % (ref 4.6–6.5)

## 2011-06-22 MED ORDER — ATORVASTATIN CALCIUM 40 MG PO TABS: 40.0000 mg | ORAL_TABLET | Freq: Every day | ORAL | Status: DC

## 2011-08-04 ENCOUNTER — Telehealth: Payer: Self-pay | Admitting: Internal Medicine

## 2011-08-04 MED ORDER — ATORVASTATIN CALCIUM 40 MG PO TABS: 40.0000 mg | ORAL_TABLET | Freq: Every day | ORAL | Status: DC

## 2011-08-04 NOTE — Telephone Encounter (Signed)
Refill- Lipitor 40mg  tablet. Take 1 tablet by mouth at bedtime.  Pharmacy comments: Patient would like 90 day supply.

## 2011-08-04 NOTE — Telephone Encounter (Signed)
Refill done.  

## 2011-09-16 ENCOUNTER — Telehealth: Payer: Self-pay | Admitting: Internal Medicine

## 2011-09-16 NOTE — Telephone Encounter (Signed)
Please advise pt he is due for a OV, fasting

## 2011-09-21 ENCOUNTER — Telehealth: Payer: Self-pay | Admitting: Internal Medicine

## 2011-09-21 NOTE — Telephone Encounter (Signed)
Message copied by Wanda Plump on Mon Sep 21, 2011  3:03 PM ------      Message from: Arvilla Meres P      Created: Thu Sep 17, 2011  2:10 PM       Spoke w/pt. He states he will come in to do labs if necessary, but does not want to come in for an OV. He states, "I have a high deductible for my insurance and I'm not as concerned about these issues as Dr. Drue Novel." I advised pt I would let you know and call him back if we can schedule only labs. Please advise.            ----- Message -----         From: Wanda Plump, MD         Sent: 09/16/2011   5:53 PM           To: Marshell Garfinkel            Please schedule a fasting OV

## 2011-09-21 NOTE — Telephone Encounter (Signed)
Please ask patient if he is taking Lipitor, and schedule labs. Also make him aware that a office visit in 4 months is required. FLP, AST, ALT--- dx hyperlipidemia Hemoglobin A1c--- dx hyperglycemia

## 2011-09-28 NOTE — Telephone Encounter (Signed)
Pt is scheduled for labs on 10-06-11. Patient states he is taking Lipitor daily, has started to exercise, and he is currently weighing 230 lbs. I explained that he would need an office visit in a few months and he was agreeable.

## 2011-09-28 NOTE — Telephone Encounter (Signed)
Thank you :)

## 2011-10-06 ENCOUNTER — Other Ambulatory Visit (INDEPENDENT_AMBULATORY_CARE_PROVIDER_SITE_OTHER): Payer: Managed Care, Other (non HMO)

## 2011-10-06 DIAGNOSIS — E785 Hyperlipidemia, unspecified: Secondary | ICD-10-CM

## 2011-10-06 DIAGNOSIS — R739 Hyperglycemia, unspecified: Secondary | ICD-10-CM

## 2011-10-06 DIAGNOSIS — R7309 Other abnormal glucose: Secondary | ICD-10-CM

## 2011-10-06 LAB — LIPID PANEL
Cholesterol: 111 mg/dL (ref 0–200)
HDL: 33.3 mg/dL — ABNORMAL LOW (ref >39.00)
VLDL: 23.4 mg/dL (ref 0.0–40.0)

## 2011-10-06 LAB — AST: AST: 27 U/L (ref 0–37)

## 2011-10-06 LAB — ALT: ALT: 41 U/L (ref 0–53)

## 2011-10-13 ENCOUNTER — Encounter: Payer: Self-pay | Admitting: *Deleted

## 2012-03-22 ENCOUNTER — Other Ambulatory Visit: Payer: Self-pay | Admitting: *Deleted

## 2012-03-22 MED ORDER — ATORVASTATIN CALCIUM 40 MG PO TABS: 40.0000 mg | ORAL_TABLET | Freq: Every day | ORAL | Status: DC

## 2012-03-22 MED ORDER — SERTRALINE HCL 100 MG PO TABS: 100.0000 mg | ORAL_TABLET | Freq: Every day | ORAL | Status: DC

## 2012-03-22 NOTE — Telephone Encounter (Signed)
[facility]. I sent in a new rx to the pharmacy & notified the pt he is due for a physical in December 2013. Pt understood.

## 2012-06-27 ENCOUNTER — Other Ambulatory Visit: Payer: Self-pay | Admitting: Internal Medicine

## 2012-06-27 NOTE — Telephone Encounter (Signed)
Pt has future appt scheduled 1.22.14. Refill done.

## 2012-07-08 ENCOUNTER — Telehealth: Payer: Self-pay | Admitting: Internal Medicine

## 2012-07-08 MED ORDER — ATORVASTATIN CALCIUM 40 MG PO TABS: 40.0000 mg | ORAL_TABLET | Freq: Every day | ORAL | Status: DC

## 2012-07-08 NOTE — Telephone Encounter (Signed)
Refill done.  

## 2012-07-08 NOTE — Telephone Encounter (Signed)
Patient moved cpe from 07-20-12 to 09-14-12 due to Dr. Drue Novel being out of the office. Patient states he is out of atorvastatin and would like to know if he should still be taking it. If so, patient will need a refill. Please advise.

## 2012-07-20 ENCOUNTER — Encounter: Payer: Self-pay | Admitting: Internal Medicine

## 2012-09-13 ENCOUNTER — Encounter: Payer: Self-pay | Admitting: Lab

## 2012-09-14 ENCOUNTER — Encounter: Payer: Self-pay | Admitting: Internal Medicine

## 2012-09-14 ENCOUNTER — Ambulatory Visit (INDEPENDENT_AMBULATORY_CARE_PROVIDER_SITE_OTHER): Payer: BC Managed Care – PPO | Admitting: Internal Medicine

## 2012-09-14 VITALS — BP 120/82 | HR 88 | Temp 99.4°F | Ht 73.0 in | Wt 240.0 lb

## 2012-09-14 DIAGNOSIS — R739 Hyperglycemia, unspecified: Secondary | ICD-10-CM

## 2012-09-14 DIAGNOSIS — R7309 Other abnormal glucose: Secondary | ICD-10-CM

## 2012-09-14 LAB — CBC WITH DIFFERENTIAL/PLATELET
Basophils Absolute: 0 10*3/uL (ref 0.0–0.1)
Eosinophils Absolute: 0.2 10*3/uL (ref 0.0–0.7)
Hemoglobin: 15.4 g/dL (ref 13.0–17.0)
Lymphocytes Relative: 29 % (ref 12.0–46.0)
MCHC: 34.2 g/dL (ref 30.0–36.0)
MCV: 85.8 fl (ref 78.0–100.0)
Monocytes Absolute: 0.5 10*3/uL (ref 0.1–1.0)
Neutro Abs: 5.1 10*3/uL (ref 1.4–7.7)
Neutrophils Relative %: 62.4 % (ref 43.0–77.0)
RDW: 13.4 % (ref 11.5–14.6)

## 2012-09-14 LAB — COMPREHENSIVE METABOLIC PANEL
ALT: 47 U/L (ref 0–53)
AST: 29 U/L (ref 0–37)
Albumin: 4.5 g/dL (ref 3.5–5.2)
Calcium: 9.9 mg/dL (ref 8.4–10.5)
Chloride: 101 mEq/L (ref 96–112)
Potassium: 4.1 mEq/L (ref 3.5–5.1)

## 2012-09-14 LAB — TSH: TSH: 2.38 u[IU]/mL (ref 0.35–5.50)

## 2012-09-14 LAB — LIPID PANEL: HDL: 42.4 mg/dL (ref >39.00)

## 2012-09-14 MED ORDER — DOXYCYCLINE HYCLATE 100 MG PO TABS: 100.0000 mg | ORAL_TABLET | Freq: Two times a day (BID) | ORAL | Status: DC

## 2012-09-14 NOTE — Assessment & Plan Note (Signed)
occ fatigue, has not check his Cpap in a while Plan-- refer to pulmonary for re-evaluation, calibration

## 2012-09-14 NOTE — Assessment & Plan Note (Addendum)
Td 07-2008  Diet-exercise discussed  Labs including testosterone per pt request and a A1C, mild hyperglycemia in the past

## 2012-09-14 NOTE — Assessment & Plan Note (Signed)
asx

## 2012-09-14 NOTE — Patient Instructions (Addendum)
Next visit one year, please make an appointment

## 2012-09-14 NOTE — Assessment & Plan Note (Signed)
Under excellent control, RF meds prn Hardly ever take clonazepam

## 2012-09-14 NOTE — Progress Notes (Signed)
  Subjective:    Patient ID: Earl Lee, male    DOB: 02/02/1966, 47 y.o.   MRN: [id number]  HPI CPX  Past Medical History: Hyperlipidemia OSA-- uses a CPAP   Fatty liver  (CT , U/S 2007) Dx at some point w/  metabolic syndrome Anxiety (Panic d/o) GERD s/p several EGD per patient, s/p dilatations GI Dr Randa Evens  Past Surgical History: diskectomy - 06/2008  Social History: Divorced, daughter lives w/ him now 1 child  and 2 step children tobacco--no ETOH-- rarely Retired from [employer], now at [employer]-- ok except for last few months, problem w/ portion control   execise-- used to play soccer,inactive due to weather, plans to do more    Family History DM-- no CAD-- M MI age 39 reportedly due to a overdose  (h/o lupus) Strokes--no Lupus-- M Colon ca--no Prostate ca--no   Review of Systems  Respiratory: Negative for cough and shortness of breath.   Cardiovascular: Negative for chest pain and leg swelling.  Gastrointestinal: Negative for nausea and blood in stool.       Chronic, sporadic upper abd pain  Genitourinary: Negative for dysuria and hematuria.  Psychiatric/Behavioral:       Sx well controlled, hardly ever needs benzos  scratched his leg at home , area is red-tender, "looks better now "   Occasional fatigue, check testosterone?     Objective:   Physical Exam  Musculoskeletal:       Legs:   General -- alert, well-developed, NAD .   Neck --no thyromegaly  Lungs -- normal respiratory effort, no intercostal retractions, no accessory muscle use, and normal breath sounds.   Heart-- normal rate, regular rhythm, no murmur, and no gallop.   Abdomen--soft, non-tender, no distention, no masses, no HSM, no guarding, and no rigidity.   Extremities-- no pretibial edema bilaterally  Neurologic-- alert & oriented X3 and strength normal in all extremities. Psych-- Cognition and judgment appear intact. Alert and cooperative with normal attention span and concentration.   not anxious appearing and not depressed appearing.       Assessment & Plan:  Mild cellulitis, although area look better per pt, still very red. Doxy x 1 week, will call if not completely well in few days

## 2012-09-15 LAB — TESTOSTERONE, FREE, TOTAL, SHBG
Sex Hormone Binding: 28 nmol/L (ref 13–71)
Testosterone, Free: 92.5 pg/mL (ref 47.0–244.0)
Testosterone-% Free: 2.2 % (ref 1.6–2.9)
Testosterone: 413 ng/dL (ref 300–890)

## 2012-09-19 ENCOUNTER — Encounter: Payer: Self-pay | Admitting: *Deleted

## 2012-09-22 ENCOUNTER — Other Ambulatory Visit: Payer: Self-pay | Admitting: Internal Medicine

## 2012-09-22 NOTE — Telephone Encounter (Signed)
Refill done.  

## 2012-09-28 ENCOUNTER — Ambulatory Visit (INDEPENDENT_AMBULATORY_CARE_PROVIDER_SITE_OTHER): Payer: BC Managed Care – PPO | Admitting: Pulmonary Disease

## 2012-09-28 ENCOUNTER — Encounter: Payer: Self-pay | Admitting: Pulmonary Disease

## 2012-09-28 VITALS — BP 122/88 | HR 75 | Temp 99.1°F | Ht 73.0 in | Wt 244.0 lb

## 2012-09-28 DIAGNOSIS — G4733 Obstructive sleep apnea (adult) (pediatric): Secondary | ICD-10-CM

## 2012-09-28 NOTE — Progress Notes (Signed)
Subjective:    Patient ID: Earl Lee, male    DOB: Jun 26, 1966, 47 y.o.   MRN: [id number]  HPI  The patient is a 47 year old male who I've been asked to see for management of obstructive sleep apnea.  He was diagnosed in 2004 with sleep apnea, and has been on CPAP compliantly since that time.  He uses nasal pillows without mouth opening, and has done very well with the device.  His current machine is over 32 years old, and he is asking about a new device.  He has not had any breakthroughs snoring or apnea, although he has seen some decrease in his feeling of restfulness in the mornings upon arising.  He has some daytime sleep pressure at times, but it does not interfere with his activities.  He does not have sleepiness in the evenings watching television, nor does he have sleepiness with driving.  His weight is unchanged over the last 2-3 years.  His Epworth score today is only 4  Sleep Questionnaire What time do you typically go to bed?( Between what hours) 11pm-12am 11pm-12am at 1417 on 09/28/12 by Caryl Ada, CMA How long does it take you to fall asleep? 10 mintues 10 mintues at 1417 on 09/28/12 by Caryl Ada, CMA How many times during the night do you wake up? 2 2 at 1417 on 09/28/12 by Caryl Ada, CMA What time do you get out of bed to start your day? 0800 0800 at 1417 on 09/28/12 by Caryl Ada, CMA Do you drive or operate heavy machinery in your occupation? No No at 1417 on 09/28/12 by Caryl Ada, CMA How much has your weight changed (up or down) over the past two years? (In pounds) 0 oz (0 kg) 0 oz (0 kg) at 1417 on 09/28/12 by Caryl Ada, CMA Have you ever had a sleep study before? Yes Yes at 1417 on 09/28/12 by Caryl Ada, CMA If yes, location of study? [facility] at 8413 on 09/28/12 by Caryl Ada, CMA If yes, date of study? Do you currently use CPAP? Yes Yes at 1417 on 09/28/12 by Caryl Ada,  CMA If so, what pressure? doesn't know doesn't know at 1417 on 09/28/12 by Caryl Ada, CMA Do you wear oxygen at any time? No No at 1417 on 09/28/12 by Caryl Ada, CMA   Review of Systems  Constitutional: Negative for fever and unexpected weight change.  HENT: Positive for congestion. Negative for ear pain, nosebleeds, sore throat, rhinorrhea, sneezing, trouble swallowing, dental problem, postnasal drip and sinus pressure.   Eyes: Negative for redness and itching.  Respiratory: Negative for cough, chest tightness, shortness of breath and wheezing.   Cardiovascular: Negative for palpitations and leg swelling.  Gastrointestinal: Negative for nausea and vomiting.  Genitourinary: Negative for dysuria.  Musculoskeletal: Negative for joint swelling.  Skin: Negative for rash.  Neurological: Negative for headaches.  Hematological: Does not bruise/bleed easily.  Psychiatric/Behavioral: Negative for dysphoric mood. The patient is nervous/anxious.        Objective:   Physical Exam Constitutional:  Overweight male, no acute distress  HENT:  Nares patent without discharge  Oropharynx without exudate, palate and uvula are mild to moderately elongated.   Eyes:  Perrla, eomi, no scleral icterus  Neck:  No JVD, no TMG  Cardiovascular:  Normal rate, regular rhythm, no rubs or gallops.  No murmurs        Intact distal pulses  Pulmonary :  Normal breath sounds, no stridor or respiratory distress   No rales, rhonchi, or wheezing  Abdominal:  Soft, nondistended, bowel sounds present.  No tenderness noted.   Musculoskeletal:  No lower extremity edema noted.  Lymph Nodes:  No cervical lymphadenopathy noted  Skin:  No cyanosis noted  Neurologic:  Alert, appropriate, moves all 4 extremities without obvious deficit.         Assessment & Plan:

## 2012-09-28 NOTE — Assessment & Plan Note (Signed)
The patient has a long-standing history of obstructive sleep apnea, and has done very well on CPAP.  His machine is now 45 years old, and he is starting to see that he is not sleeping as well.  He also has some alertness issues during the day that he did not have before, but it does not interfere with his work efficiency her quality of life.  At this point, I think he needs to get a new CPAP device, and I would also like to take this opportunity to optimize his pressure again.  The patient is agreeable to this approach.  I have also encouraged him to work aggressively on weight loss.

## 2012-09-28 NOTE — Patient Instructions (Addendum)
Will get you a new cpap machine.  Will also use an automatic setting for a few weeks to re-optimize your pressure.  Will let you know the results. Work on weight loss followup with me in one year if doing well.

## 2012-10-04 ENCOUNTER — Encounter: Payer: Self-pay | Admitting: Internal Medicine

## 2012-10-10 ENCOUNTER — Telehealth: Payer: Self-pay | Admitting: Pulmonary Disease

## 2012-10-10 NOTE — Telephone Encounter (Signed)
LM for Earl Lee for [facility] to call back

## 2012-10-11 NOTE — Telephone Encounter (Signed)
Larita Fife from [facility] returned call. Leanora Ivanoff

## 2012-10-11 NOTE — Telephone Encounter (Signed)
Per Larita Fife at [facility] there is no API on sleep study from 2004.  Spoke with Aurther Loft at [facility] sleep center and she states that they only have reports dated back to 2008.  I had Dr Shelle Iron to review sleep study report and he said that apparently a page is missing.  Almyra Free will check with sleep center to see if she can get original copy of sleep study.  LM for pt that we are working on getting new machine to him.

## 2012-10-11 NOTE — Telephone Encounter (Signed)
Pt is aware.  

## 2012-10-13 NOTE — Telephone Encounter (Signed)
PCC's do we need to do anything further. Please advise thanks

## 2012-10-13 NOTE — Telephone Encounter (Signed)
No i gave [facility] the ahi # they needed and they will go ahead with that thanks Tobe Sos

## 2012-11-14 ENCOUNTER — Telehealth: Payer: Self-pay | Admitting: Pulmonary Disease

## 2012-11-14 NOTE — Telephone Encounter (Signed)
If he is not going to get new machine, needs to have his machine thoroughly checked by dme, and also needs to be on loaner auto 5-20 for 2 weeks to optimize his pressure with download to me.

## 2012-11-14 NOTE — Telephone Encounter (Signed)
LMTCB

## 2012-11-14 NOTE — Telephone Encounter (Signed)
Per kim pt will be self pay d/t having high deductible. In the beginning pt wanted to hold off d.t this. Does pt need to be on auto for couple weeks or set machine to his old CPAP pressure. Please advise KC thanks

## 2012-11-15 NOTE — Telephone Encounter (Signed)
Spoke with Selena Batten at [facility] and notified of Dr Shelle Iron recommendations.

## 2012-11-17 ENCOUNTER — Telehealth: Payer: Self-pay | Admitting: Internal Medicine

## 2012-11-17 MED ORDER — CLONAZEPAM 0.5 MG PO TABS: 0.5000 mg | ORAL_TABLET | Freq: Two times a day (BID) | ORAL | Status: DC

## 2012-11-17 NOTE — Telephone Encounter (Signed)
Ok to refill? Last OV 3.19.14

## 2012-11-17 NOTE — Telephone Encounter (Signed)
Patient is calling to see if he should continue taking his Clonazepam Rx which is expired. He would like [facility] to [facility] if refill is approved.

## 2012-11-17 NOTE — Telephone Encounter (Signed)
Faxed

## 2012-11-17 NOTE — Telephone Encounter (Signed)
Ok #180, 1  [facility]

## 2013-01-08 ENCOUNTER — Other Ambulatory Visit: Payer: Self-pay | Admitting: Pulmonary Disease

## 2013-01-08 DIAGNOSIS — G4733 Obstructive sleep apnea (adult) (pediatric): Secondary | ICD-10-CM

## 2021-07-11 DIAGNOSIS — Z125 Encounter for screening for malignant neoplasm of prostate: Secondary | ICD-10-CM | POA: Diagnosis not present

## 2021-07-11 DIAGNOSIS — Z0000 Encounter for general adult medical examination without abnormal findings: Secondary | ICD-10-CM | POA: Diagnosis not present

## 2021-07-11 DIAGNOSIS — E782 Mixed hyperlipidemia: Secondary | ICD-10-CM | POA: Diagnosis not present

## 2022-04-24 DIAGNOSIS — M5451 Vertebrogenic low back pain: Secondary | ICD-10-CM | POA: Diagnosis not present

## 2022-05-28 DIAGNOSIS — M545 Low back pain, unspecified: Secondary | ICD-10-CM | POA: Insufficient documentation

## 2022-05-28 DIAGNOSIS — M5451 Vertebrogenic low back pain: Secondary | ICD-10-CM | POA: Diagnosis not present

## 2022-06-02 DIAGNOSIS — M5451 Vertebrogenic low back pain: Secondary | ICD-10-CM | POA: Diagnosis not present

## 2022-06-23 DIAGNOSIS — M5451 Vertebrogenic low back pain: Secondary | ICD-10-CM | POA: Diagnosis not present

## 2022-06-30 DIAGNOSIS — M5451 Vertebrogenic low back pain: Secondary | ICD-10-CM | POA: Diagnosis not present

## 2022-07-07 DIAGNOSIS — M5451 Vertebrogenic low back pain: Secondary | ICD-10-CM | POA: Diagnosis not present

## 2022-07-14 DIAGNOSIS — Z125 Encounter for screening for malignant neoplasm of prostate: Secondary | ICD-10-CM | POA: Diagnosis not present

## 2022-07-14 DIAGNOSIS — K219 Gastro-esophageal reflux disease without esophagitis: Secondary | ICD-10-CM | POA: Diagnosis not present

## 2022-07-14 DIAGNOSIS — E782 Mixed hyperlipidemia: Secondary | ICD-10-CM | POA: Diagnosis not present

## 2022-07-15 ENCOUNTER — Other Ambulatory Visit (HOSPITAL_COMMUNITY): Payer: Self-pay | Admitting: Family Medicine

## 2022-07-15 DIAGNOSIS — M5451 Vertebrogenic low back pain: Secondary | ICD-10-CM | POA: Diagnosis not present

## 2022-07-15 DIAGNOSIS — E782 Mixed hyperlipidemia: Secondary | ICD-10-CM

## 2022-07-28 DIAGNOSIS — M5451 Vertebrogenic low back pain: Secondary | ICD-10-CM | POA: Diagnosis not present

## 2022-08-06 DIAGNOSIS — Z20822 Contact with and (suspected) exposure to covid-19: Secondary | ICD-10-CM | POA: Diagnosis not present

## 2022-08-06 DIAGNOSIS — R52 Pain, unspecified: Secondary | ICD-10-CM | POA: Diagnosis not present

## 2022-08-06 DIAGNOSIS — R112 Nausea with vomiting, unspecified: Secondary | ICD-10-CM | POA: Diagnosis not present

## 2022-08-06 DIAGNOSIS — R519 Headache, unspecified: Secondary | ICD-10-CM | POA: Diagnosis not present

## 2022-08-11 ENCOUNTER — Ambulatory Visit (HOSPITAL_BASED_OUTPATIENT_CLINIC_OR_DEPARTMENT_OTHER)
Admission: RE | Admit: 2022-08-11 | Discharge: 2022-08-11 | Disposition: A | Discharge status: Home / Self Care | Payer: Self-pay | Source: Ambulatory Visit | Attending: Family Medicine | Admitting: Family Medicine

## 2022-08-11 DIAGNOSIS — E782 Mixed hyperlipidemia: Secondary | ICD-10-CM | POA: Insufficient documentation

## 2022-09-15 DIAGNOSIS — Z6833 Body mass index (BMI) 33.0-33.9, adult: Secondary | ICD-10-CM | POA: Diagnosis not present

## 2022-09-15 DIAGNOSIS — R931 Abnormal findings on diagnostic imaging of heart and coronary circulation: Secondary | ICD-10-CM | POA: Diagnosis not present

## 2022-09-15 DIAGNOSIS — R7303 Prediabetes: Secondary | ICD-10-CM | POA: Diagnosis not present

## 2022-09-15 DIAGNOSIS — E782 Mixed hyperlipidemia: Secondary | ICD-10-CM | POA: Diagnosis not present

## 2022-09-15 DIAGNOSIS — R030 Elevated blood-pressure reading, without diagnosis of hypertension: Secondary | ICD-10-CM | POA: Diagnosis not present

## 2022-10-20 ENCOUNTER — Encounter: Payer: Self-pay | Admitting: Internal Medicine

## 2022-10-20 ENCOUNTER — Ambulatory Visit: Payer: 59 | Attending: Internal Medicine | Admitting: Internal Medicine

## 2022-10-20 VITALS — BP 136/82 | HR 71 | Ht 71.0 in | Wt 256.0 lb

## 2022-10-20 DIAGNOSIS — M791 Myalgia, unspecified site: Secondary | ICD-10-CM | POA: Diagnosis not present

## 2022-10-20 DIAGNOSIS — E785 Hyperlipidemia, unspecified: Secondary | ICD-10-CM | POA: Diagnosis not present

## 2022-10-20 DIAGNOSIS — R931 Abnormal findings on diagnostic imaging of heart and coronary circulation: Secondary | ICD-10-CM

## 2022-10-20 DIAGNOSIS — T466X5A Adverse effect of antihyperlipidemic and antiarteriosclerotic drugs, initial encounter: Secondary | ICD-10-CM | POA: Diagnosis not present

## 2022-10-20 NOTE — Progress Notes (Addendum)
LIPID CLINIC CONSULT NOTE  Chief Complaint:  Manage dyslipidemia  Primary Care Physician: Earl Quan, DO  Primary Cardiologist:  None  HPI:  Earl Lee is a 57 y.o. adult who is being seen today for the evaluation of dyslipidemia at the request of Earl Pilon, FNP.  This a pleasant 57 year old male kindly referred for evaluation and management of dyslipidemia.  He does report a history of high cholesterol.  Unfortunately he has been intolerant of statins.  In the past he was on 20 mg of atorvastatin for a while but recently his cholesterol was elevated.  His atorvastatin was increased to 40 mg daily which caused significant myalgias.  Lipids at that time showed an LDL of 100, HDL 41, triglycerides 250 and total cholesterol 176.  LDL particle #1585 with a small LDL particle #1024.  LP(a) was assessed at 10.3 nmol/L which is negative.  He also had a screening calcium score which was elevated 282, 93rd percentile for age and sex matched controls.  Based on this his target LDL is less than 70 which she has not achieved.  Unfortunately he was intolerant of atorvastatin and switched to rosuvastatin without any holiday.  He reports some ongoing side effects but less significant than previous.  PMHx:  Past Medical History:  Diagnosis Date   Anxiety    panic d/o   Fatty liver    CT, U/S 2007   GERD (gastroesophageal reflux disease)    s/p several EGD per pt, s/o dilations GI Dr.Edwards   Hyperlipidemia    Metabolic syndrome    OSA (obstructive sleep apnea)    uses CPAP    Past Surgical History:  Procedure Laterality Date   CERVICAL DISCECTOMY  06/2008    FAMHx:  Family History  Problem Relation Age of Onset   Colon cancer Neg Hx    Prostate cancer Neg Hx    Diabetes Mother 54    SOCHx:   reports that he has never smoked. He uses smokeless tobacco. He reports current alcohol use. No history on file for drug use.  ALLERGIES:  Allergies  Allergen Reactions   Ibuprofen     Poultry Meal     ROS: Pertinent items noted in HPI and remainder of comprehensive ROS otherwise negative.  HOME MEDS: Current Outpatient Medications on File Prior to Visit  Medication Sig Dispense Refill   loratadine (CLARITIN) 10 MG tablet 1 tablet Orally Once a day     omeprazole (PRILOSEC) 20 MG capsule Take 20 mg by mouth daily as needed.       rosuvastatin (CRESTOR) 10 MG tablet 1 tablet Orally Once a day for 90 days     sertraline (ZOLOFT) 100 MG tablet TAKE 1 TABLET DAILY 90 tablet 3   clonazePAM (KLONOPIN) 0.5 MG tablet Take 1 tablet (0.5 mg total) by mouth 2 (two) times daily. AS NEEDED FOR ANXIETY. 180 tablet 1   No current facility-administered medications on file prior to visit.    LABS/IMAGING: No results found for this or any previous visit (from the past 48 hour(s)). No results found.  LIPID PANEL:    Component Value Date/Time   CHOL 162 09/14/2012 1111   TRIG 115.0 09/14/2012 1111   HDL 42.40 09/14/2012 1111   CHOLHDL 4 09/14/2012 1111   VLDL 23.0 09/14/2012 1111   LDLCALC 97 09/14/2012 1111   LDLDIRECT 197.4 06/17/2011 1021    WEIGHTS: Wt Readings from Last 3 Encounters:  10/20/22 256 lb (116.1 kg)  09/28/12 244  lb (110.7 kg)  09/14/12 240 lb (108.9 kg)    VITALS: BP 136/82   Pulse 71   Ht 5\' 11"  (1.803 m)   Wt 256 lb (116.1 kg)   SpO2 96%   BMI 35.70 kg/m   EXAM: Deferred  EKG: Normal sinus rhythm at 71- personally reviewed  ASSESSMENT: Mixed dyslipidemia, goal LDL less than 70 Normal LP(a) Abnormal CAC score of 282, 93rd percentile (07/2022) Statin intolerance-myalgias  PLAN: 1.   Earl Lee has a mixed dyslipidemia and remains above target LDL less than 70 however this was on therapy with statins and he is currently feeling that he is having side effects.  I asked him to take a 2-week statin holiday to see if his symptoms improve.  If they do we could consider lower dose rosuvastatin or an alternative statin therapy.  He may  ultimately benefit from nonstatin therapies or PCSK9 inhibitors.  He does have high risk calcium score.  I will contact him with the results of his repeat lipid testing.  Follow-up with me in probably about 4 to 6 months.  Thanks again for the kind referral.  Earl Nose, MD, [facility]  [place]  [facility]  Medical Director of the Advanced Lipid Disorders &  Cardiovascular Risk Reduction Clinic Diplomate of the American Board of Clinical Lipidology Attending Cardiologist  Direct Dial: [phone]  Fax: [phone]  Website:  [email or web address]  Earl Lee 10/20/2022, 3:16 PM

## 2022-10-20 NOTE — Patient Instructions (Signed)
Medication Instructions:  HOLD rosuvastatin (Crestor) for 2 weeks to see if symptoms then provide update  *If you need a refill on your cardiac medications before your next appointment, please call your pharmacy*   Lab Work: Please return for FASTING labs (NMR lipoprofile)  Our in office lab hours are Monday-Friday 8:00-4:00, closed for lunch 12:45-1:45 pm.  No appointment needed.  [facility] locations:   [place] - [address]  - [address] ([facility]) - [address] [phone] [address]    - [address]    [place]  - [address]    [place] - [address] - [address] [employer]  - [address] - [address] ([facility])  Follow-Up: At [facility], you and your health needs are our priority.  As part of our continuing mission to provide you with exceptional heart care, we have created designated Provider Care Teams.  These Care Teams include your primary Cardiologist (physician) and Advanced Practice Providers (APPs -  Physician Assistants and Nurse Practitioners) who all work together to provide you with the care you need, when you need it.  We recommend signing up for the patient portal called "MyChart".  Sign up information is provided on this After Visit Summary.  MyChart is used to connect with patients for Virtual Visits (Telemedicine).  Patients are able to view lab/test results, encounter notes, upcoming appointments, etc.  Non-urgent messages can be sent to your provider as well.   To learn more about what you can do with MyChart, go to [email or web address].    Your next appointment:   4 month(s)  Provider:   Dr. Rennis Golden

## 2022-10-23 LAB — NMR, LIPOPROFILE
Cholesterol, Total: 219 mg/dL — ABNORMAL HIGH (ref 100–199)
HDL Particle Number: 33 umol/L (ref >=30.5)
HDL-C: 40 mg/dL (ref >39)
LDL Particle Number: 2116 nmol/L — ABNORMAL HIGH (ref <1000)
LDL Size: 20.1 nm — ABNORMAL LOW (ref >20.5)
LDL-C (NIH Calc): 150 mg/dL — ABNORMAL HIGH (ref 0–99)
LP-IR Score: 70 — ABNORMAL HIGH (ref <=45)
Small LDL Particle Number: 1464 nmol/L — ABNORMAL HIGH (ref <=527)
Triglycerides: 160 mg/dL — ABNORMAL HIGH (ref 0–149)

## 2022-10-31 ENCOUNTER — Encounter: Payer: Self-pay | Admitting: Internal Medicine

## 2022-11-04 ENCOUNTER — Telehealth: Payer: Self-pay

## 2022-11-04 ENCOUNTER — Other Ambulatory Visit (HOSPITAL_COMMUNITY): Payer: Self-pay

## 2022-11-04 NOTE — Telephone Encounter (Signed)
Pharmacy Patient Advocate Encounter   Received notification from [facility] that prior authorization for REPATHA is needed.    PA submitted on 11/04/22 Key [id number] Status is pending  [name], CPhT Pharmacy Patient Advocate Specialist Direct Number: [phone] Fax: [phone]

## 2022-11-09 NOTE — Telephone Encounter (Signed)
Pharmacy Patient Advocate Encounter  Received notification from [employer] that the request for prior authorization for REPATHA has been denied due to LDL NOT HIGH ENOUGH PER PLAN REQUIREMENTS  [name], CPhT Pharmacy Patient Advocate Specialist Direct Number: [phone] Fax: [phone]

## 2022-11-09 NOTE — Telephone Encounter (Signed)
Patient Repatha was not approved.  He states stopped the Crestor for 3 weeks now.  Had worse side effects on Crestor than atorvastatin. 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  He ask to see what he can take that may not affect as much or low dose during ball season (fall and spring, finishing first few weeks of june and then restarts in September. Please advise. Patient states  would like help with dietary choices as well.  States too many different things "out there".  Any resources available.   Scheduled August 8.  No labs pending.

## 2022-11-11 ENCOUNTER — Other Ambulatory Visit: Payer: Self-pay

## 2022-11-11 DIAGNOSIS — E785 Hyperlipidemia, unspecified: Secondary | ICD-10-CM

## 2022-11-11 NOTE — Telephone Encounter (Signed)
Called patient, advised of message below.   Patient verbalized understanding, NMR ordered. Patient aware to have completed before appointment.

## 2022-11-11 NOTE — Telephone Encounter (Signed)
Called patient, LVM to call back to discuss further.   

## 2023-01-27 ENCOUNTER — Other Ambulatory Visit: Payer: Self-pay

## 2023-01-27 DIAGNOSIS — E785 Hyperlipidemia, unspecified: Secondary | ICD-10-CM

## 2023-02-02 ENCOUNTER — Encounter: Payer: Self-pay | Admitting: Internal Medicine

## 2023-02-02 ENCOUNTER — Ambulatory Visit: Payer: 59 | Attending: Internal Medicine | Admitting: Internal Medicine

## 2023-02-02 VITALS — BP 133/86 | HR 74 | Ht 73.0 in | Wt 256.8 lb

## 2023-02-02 DIAGNOSIS — R931 Abnormal findings on diagnostic imaging of heart and coronary circulation: Secondary | ICD-10-CM

## 2023-02-02 DIAGNOSIS — M791 Myalgia, unspecified site: Secondary | ICD-10-CM | POA: Diagnosis not present

## 2023-02-02 DIAGNOSIS — E785 Hyperlipidemia, unspecified: Secondary | ICD-10-CM | POA: Diagnosis not present

## 2023-02-02 DIAGNOSIS — T466X5D Adverse effect of antihyperlipidemic and antiarteriosclerotic drugs, subsequent encounter: Secondary | ICD-10-CM | POA: Diagnosis not present

## 2023-02-02 DIAGNOSIS — T466X5A Adverse effect of antihyperlipidemic and antiarteriosclerotic drugs, initial encounter: Secondary | ICD-10-CM

## 2023-02-02 MED ORDER — ATORVASTATIN CALCIUM 20 MG PO TABS: 20.0000 mg | ORAL_TABLET | Freq: Every day | ORAL | 3 refills | Status: DC

## 2023-02-02 NOTE — Patient Instructions (Signed)
Medication Instructions:  STOP crestor START atorvastatin 20mg  daily  Dr. Rennis Golden recommends Repatha Sureclick 140mg /mL OR Praluent 150mg /mL (PCSK9). This is an injectable cholesterol medication self-administered once every 14 days. This medication will likely need prior approval with your insurance company, which we will work on. If the medication is not approved initially, we may need to do an appeal with your insurance.   Administer medication in area of fatty tissue such as abdomen, outer thigh, back of upper arm - and rotate site with each injection Store medication in refrigerator until ready to administer - allow to sit at room temp for 30 mins - 1 hour prior to injection Dispose of medication in a SHARPS container - your pharmacy should be able to direct you on this and proper disposal   If you need a co-pay card for Repatha: [email or web address] If you need a co-pay card for Praluent: [email or web address]   *If you need a refill on your cardiac medications before your next appointment, please call your pharmacy*   Lab Work: FASTING lab work in 3-4 months to check cholesterol   If you have labs (blood work) drawn today and your tests are completely normal, you will receive your results only by: MyChart Message (if you have MyChart) OR A paper copy in the mail If you have any lab test that is abnormal or we need to change your treatment, we will call you to review the results.   Follow-Up: At [facility], you and your health needs are our priority.  As part of our continuing mission to provide you with exceptional heart care, we have created designated Provider Care Teams.  These Care Teams include your primary Cardiologist (physician) and Advanced Practice Providers (APPs -  Physician Assistants and Nurse Practitioners) who all work together to provide you with the care you need, when you need it.  We recommend signing up for the  patient portal called "MyChart".  Sign up information is provided on this After Visit Summary.  MyChart is used to connect with patients for Virtual Visits (Telemedicine).  Patients are able to view lab/test results, encounter notes, upcoming appointments, etc.  Non-urgent messages can be sent to your provider as well.   To learn more about what you can do with MyChart, go to [email or web address].    Your next appointment:    3-4 months with Dr. Rennis Golden

## 2023-02-02 NOTE — Progress Notes (Unsigned)
LIPID CLINIC CONSULT NOTE  Chief Complaint:  Manage dyslipidemia  Primary Care Physician: Earl Quan, DO  Primary Cardiologist:  None  HPI:  Earl Lee is a 57 y.o. adult who is being seen today for the evaluation of dyslipidemia at the request of Earl Quan, DO.  This a pleasant 57 year old male kindly referred for evaluation and management of dyslipidemia.  He does report a history of high cholesterol.  Unfortunately he has been intolerant of statins.  In the past he was on 20 mg of atorvastatin for a while but recently his cholesterol was elevated.  His atorvastatin was increased to 40 mg daily which caused significant myalgias.  Lipids at that time showed an LDL of 100, HDL 41, triglycerides 250 and total cholesterol 176.  LDL particle #1585 with a small LDL particle #1024.  LP(a) was assessed at 10.3 nmol/L which is negative.  He also had a screening calcium score which was elevated 282, 93rd percentile for age and sex matched controls.  Based on this his target LDL is less than 70 which she has not achieved.  Unfortunately he was intolerant of atorvastatin and switched to rosuvastatin without any holiday.  He reports some ongoing side effects but less significant than previous.  02/02/2023    PMHx:  Past Medical History:  Diagnosis Date   Anxiety    panic d/o   Fatty liver    CT, U/S 2007   GERD (gastroesophageal reflux disease)    s/p several EGD per pt, s/o dilations GI Dr.Edwards   Hyperlipidemia    Metabolic syndrome    OSA (obstructive sleep apnea)    uses CPAP    Past Surgical History:  Procedure Laterality Date   CERVICAL DISCECTOMY  06/2008    FAMHx:  Family History  Problem Relation Age of Onset   Colon cancer Neg Hx    Prostate cancer Neg Hx    Diabetes Mother 26    SOCHx:   reports that he has never smoked. He uses smokeless tobacco. He reports current alcohol use. No history on file for drug use.  ALLERGIES:  Allergies  Allergen Reactions    Ibuprofen    Poultry Meal     ROS: Pertinent items noted in HPI and remainder of comprehensive ROS otherwise negative.  HOME MEDS: Current Outpatient Medications on File Prior to Visit  Medication Sig Dispense Refill   omeprazole (PRILOSEC) 20 MG capsule Take 20 mg by mouth daily as needed.       sertraline (ZOLOFT) 100 MG tablet TAKE 1 TABLET DAILY 90 tablet 3   clonazePAM (KLONOPIN) 0.5 MG tablet Take 1 tablet (0.5 mg total) by mouth 2 (two) times daily. AS NEEDED FOR ANXIETY. 180 tablet 1   loratadine (CLARITIN) 10 MG tablet 1 tablet Orally Once a day (Patient not taking: Reported on 02/02/2023)     rosuvastatin (CRESTOR) 10 MG tablet 1 tablet Orally Once a day for 90 days (Patient not taking: Reported on 02/02/2023)     No current facility-administered medications on file prior to visit.    LABS/IMAGING: No results found for this or any previous visit (from the past 48 hour(s)). No results found.  LIPID PANEL:    Component Value Date/Time   CHOL 162 09/14/2012 1111   TRIG 115.0 09/14/2012 1111   HDL 42.40 09/14/2012 1111   CHOLHDL 4 09/14/2012 1111   VLDL 23.0 09/14/2012 1111   LDLCALC 97 09/14/2012 1111   LDLDIRECT 197.4 06/17/2011 1021    WEIGHTS:  Wt Readings from Last 3 Encounters:  02/02/23 256 lb 12.8 oz (116.5 kg)  10/20/22 256 lb (116.1 kg)  09/28/12 244 lb (110.7 kg)    VITALS: BP 133/86 (BP Location: Left Arm, Patient Position: Sitting, Cuff Size: Large)   Pulse 74   Ht 6\' 1"  (1.854 m)   Wt 256 lb 12.8 oz (116.5 kg)   SpO2 94%   BMI 33.88 kg/m   EXAM: Deferred  EKG: Normal sinus rhythm at 71- personally reviewed  ASSESSMENT: Mixed dyslipidemia, goal LDL less than 70 Normal LP(a) Abnormal CAC score of 282, 93rd percentile (07/2022) Statin intolerance-myalgias  PLAN: 1.   [name] has a mixed dyslipidemia and remains above target LDL less than 70 however this was on therapy with statins and he is currently feeling that he is having side  effects.  I asked him to take a 2-week statin holiday to see if his symptoms improve.  If they do we could consider lower dose rosuvastatin or an alternative statin therapy.  He may ultimately benefit from nonstatin therapies or PCSK9 inhibitors.  He does have high risk calcium score.  I will contact him with the results of his repeat lipid testing.  Follow-up with me in probably about 4 to 6 months.  Thanks again for the kind referral.  Chrystie Nose, MD, [facility]  [place]  [facility]  Medical Director of the Advanced Lipid Disorders &  Cardiovascular Risk Reduction Clinic Diplomate of the American Board of Clinical Lipidology Attending Cardiologist  Direct Dial: [phone]  Fax: [phone]  Website:  [email or web address]  Chrystie Nose 02/02/2023, 2:34 PM

## 2023-02-08 ENCOUNTER — Other Ambulatory Visit (HOSPITAL_COMMUNITY): Payer: Self-pay

## 2023-02-08 ENCOUNTER — Telehealth: Payer: Self-pay

## 2023-02-08 NOTE — Telephone Encounter (Addendum)
Pharmacy Patient Advocate Encounter   Received notification from Physician's Office/RN JENNA E. that prior authorization for REPATHA  is required/requested.   Insurance verification completed.   The patient is insured through [employer] .   Per test claim: PA required; PA submitted to [employer] via CoverMyMeds Key/confirmation #/EOC Key: [id number]       Status is pending   Sent via fax on cmm

## 2023-02-09 NOTE — Telephone Encounter (Signed)
Initial p/a was denied. I have reinitiated under a different icd code.  New P/A  has been submitted and is currently pending a determination  Key: [id number]

## 2023-02-10 ENCOUNTER — Other Ambulatory Visit (HOSPITAL_COMMUNITY): Payer: Self-pay

## 2023-02-11 ENCOUNTER — Other Ambulatory Visit (HOSPITAL_COMMUNITY): Payer: Self-pay

## 2023-02-11 NOTE — Telephone Encounter (Signed)
Initiating 3rd P/A (last attempt) before an appeal  Key: [id number]

## 2023-02-15 ENCOUNTER — Encounter: Payer: Self-pay | Admitting: Internal Medicine

## 2023-02-16 ENCOUNTER — Other Ambulatory Visit (HOSPITAL_COMMUNITY): Payer: Self-pay

## 2023-02-16 NOTE — Telephone Encounter (Signed)
Pharmacy Patient Advocate Encounter  P/A has been initiated verablly for Repatha 140mg /ml.  The Prior Berkley Harvey has been denied. I have resubmitted for 2 different diagnosis (ASCVD, and Hyperlipidemia) both denied.  For a diagnosis of ASCVD the patients CAC score would need to be 300 or >. For the diagnosis of hyperlipidemia he LDL-C value would need to be 190 or >.  Per the pts plan an appeal can be sent in at [phone]. Also the denial letters will be sent over as well.

## 2023-02-18 NOTE — Telephone Encounter (Signed)
Update sent to patient via MyChart to continue meds as currently prescribed given insurance criteria for approval and patient not meeting those.

## 2023-05-18 LAB — NMR, LIPOPROFILE
Cholesterol, Total: 174 mg/dL (ref 100–199)
HDL Particle Number: 34.3 umol/L (ref >=30.5)
HDL-C: 38 mg/dL — ABNORMAL LOW (ref >39)
LDL Particle Number: 1393 nmol/L — ABNORMAL HIGH (ref <1000)
LDL Size: 20.2 nmol — ABNORMAL LOW (ref >20.5)
LDL-C (NIH Calc): 111 mg/dL — ABNORMAL HIGH (ref 0–99)
LP-IR Score: 70 — ABNORMAL HIGH (ref <=45)
Small LDL Particle Number: 907 nmol/L — ABNORMAL HIGH (ref <=527)
Triglycerides: 137 mg/dL (ref 0–149)

## 2023-05-20 ENCOUNTER — Ambulatory Visit: Payer: 59 | Attending: Internal Medicine | Admitting: Internal Medicine

## 2023-05-20 ENCOUNTER — Encounter: Payer: Self-pay | Admitting: Internal Medicine

## 2023-05-20 VITALS — BP 126/76 | HR 69 | Ht 73.0 in | Wt 265.8 lb

## 2023-05-20 DIAGNOSIS — E785 Hyperlipidemia, unspecified: Secondary | ICD-10-CM

## 2023-05-20 DIAGNOSIS — E66813 Obesity, class 3: Secondary | ICD-10-CM

## 2023-05-20 DIAGNOSIS — R931 Abnormal findings on diagnostic imaging of heart and coronary circulation: Secondary | ICD-10-CM | POA: Diagnosis not present

## 2023-05-20 MED ORDER — EZETIMIBE 10 MG PO TABS: 10.0000 mg | ORAL_TABLET | Freq: Every day | ORAL | 3 refills | Status: DC

## 2023-05-20 NOTE — Patient Instructions (Signed)
Medication Instructions:  START zetia 10mg  daily   *If you need a refill on your cardiac medications before your next appointment, please call your pharmacy*   Lab Work: NMR lipoprofile in 4 months  If you have labs (blood work) drawn today and your tests are completely normal, you will receive your results only by: MyChart Message (if you have MyChart) OR A paper copy in the mail If you have any lab test that is abnormal or we need to change your treatment, we will call you to review the results.    Follow-Up: At [facility], you and your health needs are our priority.  As part of our continuing mission to provide you with exceptional heart care, we have created designated Provider Care Teams.  These Care Teams include your primary Cardiologist (physician) and Advanced Practice Providers (APPs -  Physician Assistants and Nurse Practitioners) who all work together to provide you with the care you need, when you need it.  We recommend signing up for the patient portal called "MyChart".  Sign up information is provided on this After Visit Summary.  MyChart is used to connect with patients for Virtual Visits (Telemedicine).  Patients are able to view lab/test results, encounter notes, upcoming appointments, etc.  Non-urgent messages can be sent to your provider as well.   To learn more about what you can do with MyChart, go to [email or web address].    Your next appointment:   4 months with Dr. Rennis Golden -- lipid clinic  Other Instructions  You have been referred to [facility] Nutrition services   You have been referred to [facility] team -- weight management medication review

## 2023-05-20 NOTE — Progress Notes (Signed)
LIPID CLINIC CONSULT NOTE  Chief Complaint:  Follow-up dyslipidemia  Primary Care Physician: Shireen Quan, DO  Primary Cardiologist:  None  HPI:  Earl Lee is a 57 y.o. adult who is being seen today for the evaluation of dyslipidemia at the request of Shireen Quan, DO.  This a pleasant 57 year old male kindly referred for evaluation and management of dyslipidemia.  He does report a history of high cholesterol.  Unfortunately he has been intolerant of statins.  In the past he was on 20 mg of atorvastatin for a while but recently his cholesterol was elevated.  His atorvastatin was increased to 40 mg daily which caused significant myalgias.  Lipids at that time showed an LDL of 100, HDL 41, triglycerides 250 and total cholesterol 176.  LDL particle #1585 with a small LDL particle #1024.  LP(a) was assessed at 10.3 nmol/L which is negative.  He also had a screening calcium score which was elevated 282, 93rd percentile for age and sex matched controls.  Based on this his target LDL is less than 70 which she has not achieved.  Unfortunately he was intolerant of atorvastatin and switched to rosuvastatin without any holiday.  He reports some ongoing side effects but less significant than previous.  02/02/2023  Earl Lee returns today for follow-up.  Unfortunately, he reported he had side effects on rosuvastatin which he says was more poorly tolerated than atorvastatin.  Previously he was able to tolerate 20 mg of atorvastatin but felt worse when it was increased up to 40 mg.  Unfortunately coming off the statin has caused a substantial increase in his cholesterol.  Although his calculated LDL was 95 (down from 150), his LDL particle number went up to 2402 with a low HDL of 25 and very high triglycerides at 633 (up from 160).  05/20/2023  Earl Lee is seen today for follow-up.  He has been on atorvastatin.  He has had a significant improvement in his cholesterol.  His LDL particle numbers come  down from 2402 down to 1393, LDL 111 and significant improvement in triglycerides to 137 (previously 633).  He did have some initial concerns about the statin as to whether it might be worsening his depression however he says that ebbs and flows and he is confident to stay on it.  LDL still remains above target despite a nice improvement and we talked about other options to lower it further today.  He is also concerned about inability to lose weight and wondered if he might qualify for any of the new weight loss medications and/or nutritional advice.  PMHx:  Past Medical History:  Diagnosis Date   Anxiety    panic d/o   Fatty liver    CT, U/S 2007   GERD (gastroesophageal reflux disease)    s/p several EGD per pt, s/o dilations GI Dr.Edwards   Hyperlipidemia    Metabolic syndrome    OSA (obstructive sleep apnea)    uses CPAP    Past Surgical History:  Procedure Laterality Date   CERVICAL DISCECTOMY  06/2008    FAMHx:  Family History  Problem Relation Age of Onset   Colon cancer Neg Hx    Prostate cancer Neg Hx    Diabetes Mother 63    SOCHx:   reports that he has never smoked. He uses smokeless tobacco. He reports current alcohol use. No history on file for drug use.  ALLERGIES:  Allergies  Allergen Reactions   Ibuprofen    Crestor [Rosuvastatin]  Other (See Comments)    myalgias   Poultry Meal     ROS: Pertinent items noted in HPI and remainder of comprehensive ROS otherwise negative.  HOME MEDS: Current Outpatient Medications on File Prior to Visit  Medication Sig Dispense Refill   loratadine (CLARITIN) 10 MG tablet Take 10 mg by mouth daily as needed.     omeprazole (PRILOSEC) 20 MG capsule Take 20 mg by mouth daily as needed.       sertraline (ZOLOFT) 100 MG tablet TAKE 1 TABLET DAILY 90 tablet 3   atorvastatin (LIPITOR) 20 MG tablet Take 1 tablet (20 mg total) by mouth daily. 90 tablet 3   clonazePAM (KLONOPIN) 0.5 MG tablet Take 1 tablet (0.5 mg total) by mouth  2 (two) times daily. AS NEEDED FOR ANXIETY. 180 tablet 1   No current facility-administered medications on file prior to visit.    LABS/IMAGING: No results found for this or any previous visit (from the past 48 hour(s)). No results found.  LIPID PANEL:    Component Value Date/Time   CHOL 162 09/14/2012 1111   TRIG 115.0 09/14/2012 1111   HDL 42.40 09/14/2012 1111   CHOLHDL 4 09/14/2012 1111   VLDL 23.0 09/14/2012 1111   LDLCALC 97 09/14/2012 1111   LDLDIRECT 197.4 06/17/2011 1021    WEIGHTS: Wt Readings from Last 3 Encounters:  05/20/23 265 lb 12.8 oz (120.6 kg)  02/02/23 256 lb 12.8 oz (116.5 kg)  10/20/22 256 lb (116.1 kg)    VITALS: BP 126/76 (BP Location: Left Arm, Patient Position: Sitting, Cuff Size: Normal)   Pulse 69   Ht 6\' 1"  (1.854 m)   Wt 265 lb 12.8 oz (120.6 kg)   SpO2 95%   BMI 35.07 kg/m   EXAM: Deferred  EKG: Deferred  ASSESSMENT: Mixed dyslipidemia, goal LDL less than 70 Normal LP(a) Abnormal CAC score of 282, 93rd percentile (07/2022) Statin intolerance-myalgias Class III obesity  PLAN: 1.   Earl Lee has had a significant improvement in his lipids on atorvastatin.  His LDL still remains above target I think we will need additional therapy for now.  I discussed adding ezetimibe and will start 10 mg daily.  Repeat lipids in about 4 months.  In addition we will refer him to a nutritionist for further education.  Appetite is a main factor and I think he could also benefit from [facility] given a history of cardiovascular disease and BMI of 35.  Will place a Pharm.D. referral to see if he might qualify for this medication as well.  Follow-up with me in 4 months.  Chrystie Nose, MD, [facility], FACP  [place]  [facility]  Medical Director of the Advanced Lipid Disorders &  Cardiovascular Risk Reduction Clinic Diplomate of the American Board of Clinical Lipidology Attending Cardiologist  Direct Dial: [phone]  Fax: [phone]   Website:  [email or web address] Sashay Felling 05/20/2023, 8:09 AM

## 2023-06-28 ENCOUNTER — Encounter: Payer: Self-pay | Admitting: Pharmacist

## 2023-06-28 ENCOUNTER — Ambulatory Visit: Payer: 59 | Attending: Internal Medicine | Admitting: Pharmacist

## 2023-06-28 VITALS — Ht 73.0 in | Wt 259.0 lb

## 2023-06-28 DIAGNOSIS — E66813 Obesity, class 3: Secondary | ICD-10-CM | POA: Diagnosis not present

## 2023-06-28 DIAGNOSIS — G4733 Obstructive sleep apnea (adult) (pediatric): Secondary | ICD-10-CM

## 2023-06-28 DIAGNOSIS — E785 Hyperlipidemia, unspecified: Secondary | ICD-10-CM

## 2023-06-28 NOTE — Progress Notes (Signed)
Patient ID: Earl Lee                 DOB: [birth year]                    MRN: [id number]     HPI: Earl Lee is a 57 y.o. adult patient referred to pharmacy clinic by Dr Rennis Golden to initiate GLP1-RA therapy. PMH is significant for OSA, HLD, anxiety, and obesity. BMI today 34.18.  Patient presents today to discuss weight loss. Currently followed by PCP and Dr Rennis Golden. Has referral to nutritionist scheduled for 07/20/23.  Believes part of the cause of his weight gain may be due to sertraline. He discontinued it in the past and was able to lose weight. However then had anxiety and panic attacks so it was restarted and he began to put weight back on.  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  Denies eating junk food and sweets.  In the morning he drinks coffee with instant grits, blueberries, and egg whites. Will purchase 2 subs at lunch from [employer] and will have one for lunch and one for dinner. Snacks on vegetables such as carrot sticks. Drinks water, unsweetened tea, and seltzer water. Has 3-4 beers per week.  Patient currently prediabetic with obstructive sleep apnea.  Reports sometimes after he eats he does not feel full.  Labs: Lab Results  Component Value Date   HGBA1C 5.6 09/14/2012    Wt Readings from Last 1 Encounters:  05/20/23 265 lb 12.8 oz (120.6 kg)    BP Readings from Last 1 Encounters:  05/20/23 126/76   Pulse Readings from Last 1 Encounters:  05/20/23 69       Component Value Date/Time   CHOL 162 09/14/2012 1111   TRIG 115.0 09/14/2012 1111   HDL 42.40 09/14/2012 1111   CHOLHDL 4 09/14/2012 1111   VLDL 23.0 09/14/2012 1111   LDLCALC 97 09/14/2012 1111   LDLDIRECT 197.4 06/17/2011 1021    Past Medical History:  Diagnosis Date   Anxiety    panic d/o   Fatty liver    CT, U/S 2007   GERD (gastroesophageal reflux disease)    s/p several  EGD per pt, s/o dilations GI Dr.Edwards   Hyperlipidemia    Metabolic syndrome    OSA (obstructive sleep apnea)    uses CPAP    Current Outpatient Medications on File Prior to Visit  Medication Sig Dispense Refill   atorvastatin (LIPITOR) 20 MG tablet Take 1 tablet (20 mg total) by mouth daily. 90 tablet 3   ezetimibe (ZETIA) 10 MG tablet Take 1 tablet (10 mg total) by mouth daily. 90 tablet 3   loratadine (CLARITIN) 10 MG tablet Take 10 mg by mouth daily as needed.     omeprazole (PRILOSEC) 20 MG capsule Take 20 mg by mouth daily as needed.       sertraline (ZOLOFT) 100 MG tablet TAKE 1 TABLET DAILY 90 tablet 3   No current facility-administered medications on file prior to visit.    Allergies  Allergen Reactions   Ibuprofen    Crestor [Rosuvastatin] Other (See Comments)    myalgias   Poultry Meal      Assessment/Plan:  1. Weight loss - Patient's BMI today 34.18 placing him as having Class I obesity. Diet sounds fairly healthy although he may be eating portions that  are too large. Is not as physically active over the Winter until his work picks up.  Discussed basics of macronutrients and pathophysiology of weight gain including increasing vegetables, lean proteins, healthy fats and whole grains while reducing carbohydrates. Encouraged patient to keep nutrition appointment next month.  Would likely benefit from [facility] therapy however unclear if it will be covered since his plan did not cover Repatha earlier this year. Will submit PA for Zepbound and Wegovy and contact patient with response. Confirmed he has no personal or family history of medullary thyroid carcinoma (MTC) or Multiple Endocrine Neoplasia syndrome type 2 (MEN 2). Injection technique reviewed at today's visit.  Advised patient on common side effects including nausea, diarrhea, dyspepsia, decreased appetite, and fatigue. Counseled patient on reducing meal size and how to titrate medication to minimize side effects.  Counseled patient to call if intolerable side effects or if experiencing dehydration, abdominal pain, or dizziness. Patient will adhere to dietary modifications and will target at least 150 minutes of moderate intensity exercise weekly.   Follow up in 1 month via telephone for tolerability update and dose titration.   Laural Golden, PharmD, BCACP, CDCES, CPP [address] [place], [place], [address] Phone: [phone], Fax: [phone]

## 2023-06-28 NOTE — Patient Instructions (Addendum)
It was nice meeting you today  The medications we discussed today are called Wegovy and Zepbound  I will complete the prior authorizations for you and contact you with the result  We recommend focusing on vegetables, fruits, whole grains, healthy fats, and lean proteins  Please let us know if you have any questions  Laural Golden, PharmD, BCACP, CDCES, CPP [address] [place], [place], [address] Phone: [phone], Fax: [phone]

## 2023-07-20 ENCOUNTER — Ambulatory Visit: Payer: 59 | Admitting: Dietician

## 2023-07-20 ENCOUNTER — Ambulatory Visit: Payer: 59 | Admitting: Skilled Nursing Facility1

## 2023-08-03 ENCOUNTER — Other Ambulatory Visit: Payer: Self-pay

## 2023-08-03 MED ORDER — ATORVASTATIN CALCIUM 20 MG PO TABS: 20.0000 mg | ORAL_TABLET | Freq: Every day | ORAL | 3 refills | Status: DC

## 2023-08-10 ENCOUNTER — Encounter: Payer: Self-pay | Admitting: Dietician

## 2023-08-10 ENCOUNTER — Encounter: Payer: Commercial Managed Care - HMO | Attending: Internal Medicine | Admitting: Dietician

## 2023-08-10 VITALS — Ht 73.0 in | Wt 247.4 lb

## 2023-08-10 DIAGNOSIS — E785 Hyperlipidemia, unspecified: Secondary | ICD-10-CM | POA: Diagnosis present

## 2023-08-10 DIAGNOSIS — E669 Obesity, unspecified: Secondary | ICD-10-CM | POA: Insufficient documentation

## 2023-08-10 NOTE — Progress Notes (Signed)
Medical Nutrition Therapy  Appointment Start time:  52  Appointment End time:  1220  Primary concerns today: portion control, and feelings of hunger, weight loss and decrease LDL Referral diagnosis: hyperlipidemia, unspecified (E78.5), obesity, unspecified (E66.9) Preferred learning style: no preference indicated (auditory, visual, hands on, no preference indicated) Learning readiness: ready (not ready, contemplating, ready, change in progress)  NUTRITION ASSESSMENT   Clinical Medical Hx: anxiety disorder Medications: sertraline, ezetimibe, omeprazole, atorvastatin, nasal spray Labs: glucose 132 (not fasting); lipids (see EMR) Notable Signs/Symptoms: none noted  Lifestyle & Dietary Hx  Pt states he has lost 10 pounds on his own (since Nov). Pt states he is triggered with anxiety when eating, stating he needs to work on portion control. Pt states he can not feel a sense of satisfaction. Pt states he does not know how much food to eat. Pt states he has been doing better with this, stating he has really tried to focus on not eating too much. 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 Pt states he will drink a lot of water when he is working, stating more than 64 oz. Pt state he seldom eats sweets, stating the closest he gets to sweets is a granola bar for dessert. Pt states his cholesterol has always been high, stating his cholesterol is a whole lot better. Pt states he is seeing a doctor that specializes in Lipidology. Pt states this doctor wants his LDL down to below 70. Pt states he feels down or depressed or sad, about 6 pm and states at 9 pm he is okay. Pt states it may be connected to feeling hungry. Pt states he is allergic to poultry (all birds). Pt states he is not interested in physical activity, stating he has tried it before and states it is boring.  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 Uses a CPAP machine Stress / self-care: anxiety, manages it with less meds right now. Pt states he will be able to call people as needed and know that, helps. Current average weekly physical activity: ADLs (yard work), landscape during the spring/summer, pt stating pretty intensive stuff. Pt states he gets a deep tissue massage every two weeks.  24-Hr Dietary Recall First Meal: coffee, toaster waffles, grits, blueberries, stevia Snack:  Second Meal: hamburger (no fries) from [employer] or half ham sub from [facility] Snack:  Third Meal: smoothie (protein shake, yogurt, frozen fruit), broccoli cheddar rice with other vegetables Snack: sometimes a banana or clementine orange Beverages: yingling black and tan beer (one per day, if he has one), smoothie, coffee, un-sweet tea, water, bubbly seltzer water, apple juice (some)  NUTRITION DIAGNOSIS  NB-1.1 Food and nutrition-related knowledge deficit As related to food choices and excessive calorie intake.  As evidenced by food recall and history of disordered eating patterns.  NUTRITION INTERVENTION  Nutrition education (E-1) on the following topics:  Encouraged patient to honor their body's internal hunger and fullness cues.  Throughout the day, check in mentally and rate hunger. Stop eating when satisfied not full regardless of how much food is left on the plate.  Get more if still hungry 20-30 minutes later.  The key is to honor satisfaction so throughout the meal, rate fullness factor and stop when comfortably satisfied not physically full. The key is to honor  hunger and fullness without any feelings of guilt or shame.  Pay attention to what the internal cues are, rather than any external factors. This will enhance the confidence you have in listening to your own body and following those internal cues enabling you to increase how  often you eat when you are hungry not out of appetite and stop when you are satisfied not full.  Encouraged pt to continue to eat balanced meals inclusive of non starchy vegetables 2 times a day 7 days a week Encouraged pt to choose lean protein sources: limiting beef, pork, sausage, hotdogs, and lunch meat Encourage pt to choose healthy fats such as plant based limiting animal fats Encouraged pt to continue to drink a minium 64 fluid ounces with half being plain water to satisfy proper hydration   Encouraged pt to eat a protein with every meal or snack; protein helps [place] feel satisfied longer. Pt is encouraged to consume less processed foods, as they have the potential to negatively affect overall health. Processed foods often destroy or remove nutrients from the product and/or have added salt, sugar, and saturated fats. Although not all processed foods are a concern, it is advised to have the majority of our foods be unprocessed or minimally processed as opposed to processed and ultra-processed. An example of this would be a whole apple (unprocessed), prepackaged apple slices with no additives (minimally processed), unsweetened applesauce (processed), and sweetened applesauce or apple juice with high fructose corn syrup (ultra-processed). For further information please visit [email or web address].  Fruits & Vegetables: Aim to fill half your plate with a variety of fruits and vegetables. They are rich in vitamins, minerals, and fiber, and can help reduce the risk of chronic diseases. Choose a colorful assortment of fruits and vegetables to ensure you get a wide range of nutrients. Grains and Starches: Make at least half of your grain choices whole grains, such as Benedict Kue rice, whole wheat bread, and oats. Whole grains provide fiber, which aids in digestion and healthy cholesterol levels. Aim for whole forms of starchy vegetables such as potatoes, sweet potatoes,  beans, peas, and corn, which are fiber rich and provide many vitamins and minerals.  Protein: Incorporate lean sources of protein, such as poultry, fish, beans, nuts, and seeds, into your meals. Protein is essential for building and repairing tissues, staying full, balancing blood sugar, as well as supporting immune function. Dairy: Include low-fat or fat-free dairy products like milk, yogurt, and cheese in your diet. Dairy foods are excellent sources of calcium and vitamin D, which are crucial for bone health.  Physical Activity: Aim for 150 minutes of physical activity weekly. Regular physical activity promotes overall health-including helping to reduce risk for heart disease and diabetes, promoting mental health, and helping [place] sleep better.  Use the "plate" method to assist with portion control.  Handouts Provided Include  Meal Ideas Handout Types of Fat (saturated vs unsaturated) MyPlate: Eat Right With MyPlate  Learning Style & Readiness for Change Teaching method utilized: Visual & Auditory  Demonstrated degree of understanding via: Teach Back  Barriers to learning/adherence to lifestyle change: history of disordered eating patterns.  Goals Established by Pt Have a protein food with every meal or snack; aim for 80 grams per day Increase non-starchy vegetables; increase fiber intake (goal for men is 35 grams per day) Decrease saturated fat; choose lean cuts of meat; choose unsaturated oils when cooking.  MONITORING & EVALUATION Dietary intake, weekly physical activity.  Next Steps  Patient declined follow-up visit.  Pt will call for an appointment if needed.

## 2023-09-15 LAB — NMR, LIPOPROFILE
Cholesterol, Total: 139 mg/dL (ref 100–199)
HDL Particle Number: 33.1 umol/L (ref >=30.5)
HDL-C: 38 mg/dL — ABNORMAL LOW (ref >39)
LDL Particle Number: 1029 nmol/L — ABNORMAL HIGH (ref <1000)
LDL Size: 20.1 nm — ABNORMAL LOW (ref >20.5)
LDL-C (NIH Calc): 77 mg/dL (ref 0–99)
LP-IR Score: 59 — ABNORMAL HIGH (ref <=45)
Small LDL Particle Number: 666 nmol/L — ABNORMAL HIGH (ref <=527)
Triglycerides: 138 mg/dL (ref 0–149)

## 2023-09-16 ENCOUNTER — Encounter: Payer: Self-pay | Admitting: Internal Medicine

## 2023-09-16 ENCOUNTER — Ambulatory Visit: Payer: 59 | Attending: Internal Medicine | Admitting: Internal Medicine

## 2023-09-16 ENCOUNTER — Encounter: Payer: Self-pay | Admitting: Pharmacist

## 2023-09-16 VITALS — BP 120/60 | HR 72 | Ht 73.0 in | Wt 247.0 lb

## 2023-09-16 DIAGNOSIS — G4733 Obstructive sleep apnea (adult) (pediatric): Secondary | ICD-10-CM | POA: Diagnosis not present

## 2023-09-16 DIAGNOSIS — E66813 Obesity, class 3: Secondary | ICD-10-CM | POA: Diagnosis not present

## 2023-09-16 DIAGNOSIS — R7303 Prediabetes: Secondary | ICD-10-CM | POA: Insufficient documentation

## 2023-09-16 DIAGNOSIS — Z6832 Body mass index (BMI) 32.0-32.9, adult: Secondary | ICD-10-CM | POA: Insufficient documentation

## 2023-09-16 DIAGNOSIS — R931 Abnormal findings on diagnostic imaging of heart and coronary circulation: Secondary | ICD-10-CM | POA: Diagnosis not present

## 2023-09-16 DIAGNOSIS — E785 Hyperlipidemia, unspecified: Secondary | ICD-10-CM | POA: Diagnosis not present

## 2023-09-16 DIAGNOSIS — F411 Generalized anxiety disorder: Secondary | ICD-10-CM | POA: Insufficient documentation

## 2023-09-16 DIAGNOSIS — R030 Elevated blood-pressure reading, without diagnosis of hypertension: Secondary | ICD-10-CM | POA: Insufficient documentation

## 2023-09-16 NOTE — Progress Notes (Signed)
 LIPID CLINIC CONSULT NOTE  Chief Complaint:  Follow-up dyslipidemia  Primary Care Physician: Jackelyn Poling, DO  Primary Cardiologist:  Chrystie Nose, MD  HPI:  Earl Lee is a 58 y.o. adult who is being seen today for the evaluation of dyslipidemia at the request of Shireen Quan, DO.  This a pleasant 58 year old male kindly referred for evaluation and management of dyslipidemia.  He does report a history of high cholesterol.  Unfortunately he has been intolerant of statins.  In the past he was on 20 mg of atorvastatin for a while but recently his cholesterol was elevated.  His atorvastatin was increased to 40 mg daily which caused significant myalgias.  Lipids at that time showed an LDL of 100, HDL 41, triglycerides 250 and total cholesterol 176.  LDL particle #1585 with a small LDL particle #1024.  LP(a) was assessed at 10.3 nmol/L which is negative.  He also had a screening calcium score which was elevated 282, 93rd percentile for age and sex matched controls.  Based on this his target LDL is less than 70 which she has not achieved.  Unfortunately he was intolerant of atorvastatin and switched to rosuvastatin without any holiday.  He reports some ongoing side effects but less significant than previous.  02/02/2023  Earl Lee returns today for follow-up.  Unfortunately, he reported he had side effects on rosuvastatin which he says was more poorly tolerated than atorvastatin.  Previously he was able to tolerate 20 mg of atorvastatin but felt worse when it was increased up to 40 mg.  Unfortunately coming off the statin has caused a substantial increase in his cholesterol.  Although his calculated LDL was 95 (down from 150), his LDL particle number went up to 2402 with a low HDL of 25 and very high triglycerides at 633 (up from 160).  05/20/2023  Earl Lee is seen today for follow-up.  He has been on atorvastatin.  He has had a significant improvement in his cholesterol.  His LDL  particle numbers come down from 2402 down to 1393, LDL 111 and significant improvement in triglycerides to 137 (previously 633).  He did have some initial concerns about the statin as to whether it might be worsening his depression however he says that ebbs and flows and he is confident to stay on it.  LDL still remains above target despite a nice improvement and we talked about other options to lower it further today.  He is also concerned about inability to lose weight and wondered if he might qualify for any of the new weight loss medications and/or nutritional advice.  09/16/2023  Earl Lee is here today in follow-up.  Overall he is doing well.  He is recently lost a little bit of weight on his own.  His lipids are better.  His LDL is now down to 77 (down from 111) and LDL particle number is 1029.  I had referred him to our clinical pharmacist to discuss weight loss medications.  Initially there were a couple attempts to do this with his prior insurance however I believe it was denied.  He never received a letter about it.  He subsequently and switched to new insurance this year.  He does have a diagnosis of obstructive sleep apnea on CPAP.  Based on new prescribing guidelines he might qualify for Zepbound.  I discussed this with our pharmacist today and will go ahead and try and pursue that for him.  PMHx:  Past Medical History:  Diagnosis  Date   Anxiety    panic d/o   Fatty liver    CT, U/S 2007   GERD (gastroesophageal reflux disease)    s/p several EGD per pt, s/o dilations GI Dr.Edwards   Hyperlipidemia    Metabolic syndrome    OSA (obstructive sleep apnea)    uses CPAP    Past Surgical History:  Procedure Laterality Date   CERVICAL DISCECTOMY  06/2008    FAMHx:  Family History  Problem Relation Age of Onset   Colon cancer Neg Hx    Prostate cancer Neg Hx    Diabetes Mother 34    SOCHx:   reports that he has never smoked. He uses smokeless tobacco. He reports current  alcohol use. No history on file for drug use.  ALLERGIES:  Allergies  Allergen Reactions   Ibuprofen    Crestor [Rosuvastatin] Other (See Comments)    myalgias   Poultry Meal     ROS: Pertinent items noted in HPI and remainder of comprehensive ROS otherwise negative.  HOME MEDS: Current Outpatient Medications on File Prior to Visit  Medication Sig Dispense Refill   atorvastatin (LIPITOR) 20 MG tablet Take 1 tablet (20 mg total) by mouth daily. 90 tablet 3   fluticasone (FLONASE) 50 MCG/ACT nasal spray Place into both nostrils daily.     loratadine (CLARITIN) 10 MG tablet Take 10 mg by mouth daily as needed.     omeprazole (PRILOSEC) 20 MG capsule Take 20 mg by mouth daily as needed.       sertraline (ZOLOFT) 100 MG tablet TAKE 1 TABLET DAILY 90 tablet 3   ezetimibe (ZETIA) 10 MG tablet Take 1 tablet (10 mg total) by mouth daily. 90 tablet 3   No current facility-administered medications on file prior to visit.    LABS/IMAGING: No results found for this or any previous visit (from the past 48 hours). No results found.  LIPID PANEL:    Component Value Date/Time   CHOL 162 09/14/2012 1111   TRIG 115.0 09/14/2012 1111   HDL 42.40 09/14/2012 1111   CHOLHDL 4 09/14/2012 1111   VLDL 23.0 09/14/2012 1111   LDLCALC 97 09/14/2012 1111   LDLDIRECT 197.4 06/17/2011 1021    WEIGHTS: Wt Readings from Last 3 Encounters:  09/16/23 247 lb (112 kg)  08/10/23 247 lb 6.4 oz (112.2 kg)  06/28/23 259 lb (117.5 kg)    VITALS: BP 120/60   Pulse 72   Ht 6\' 1"  (1.854 m)   Wt 247 lb (112 kg)   SpO2 96%   BMI 32.59 kg/m   EXAM: Deferred  EKG: Deferred  ASSESSMENT: Mixed dyslipidemia, goal LDL less than 70 Normal LP(a) Abnormal CAC score of 282, 93rd percentile (07/2022) Rosuvastatin intolerance-myalgias Class III obesity OSA on CPAP  PLAN: 1.   Earl Lee has had further improvement in his lipids and is near target LDL less than 70.  He is asymptomatic without chest pain  or shortness of breath.  He is tolerating atorvastatin and ezetimibe.  Will continue with this.  He does have obstructive sleep apnea on CPAP.  Based on this and discussion with our pharmacist he may be a candidate for Zepbound for weight loss.  Will go ahead and try to pursue this with his new insurance.  Plan repeat lipids in about 6 months but he would like to follow-up in a year to reduce the cost of follow-up visits which is fine.  He can see me or Marcelino Duster.  Lisette Abu  Rennis Golden, MD, [facility], FACP  [place]  [facility]  Medical Director of the Advanced Lipid Disorders &  Cardiovascular Risk Reduction Clinic Diplomate of the American Board of Clinical Lipidology Attending Cardiologist  Direct Dial: [phone]  Fax: [phone]  Website:  [email or web address]  Chrystie Nose 09/16/2023, 3:16 PM

## 2023-09-16 NOTE — Patient Instructions (Addendum)
 Medication Instructions:   No changes  *If you need a refill on your cardiac medications before your next appointment, please call your pharmacy*   Lab Work:  NMR- lipoprofile - 6 months  fasting ( Sept 2025)   If you have labs (blood work) drawn today and your tests are completely normal, you will receive your results only by: MyChart Message (if you have MyChart) OR A paper copy in the mail If you have any lab test that is abnormal or we need to change your treatment, we will call you to review the results.   Testing/Procedures:  Not needed  Follow-Up: At [facility], you and your health needs are our priority.  As part of our continuing mission to provide you with exceptional heart care, we have created designated Provider Care Teams.  These Care Teams include your primary Cardiologist (physician) and Advanced Practice Providers (APPs -  Physician Assistants and Nurse Practitioners) who all work together to provide you with the care you need, when you need it.     Your next appointment:   12 month(s)  The format for your next appointment:   In Person  Provider:   Chrystie Nose, MD or Eligha Bridegroom   Other Instructions

## 2023-09-17 ENCOUNTER — Other Ambulatory Visit (HOSPITAL_COMMUNITY): Payer: Self-pay

## 2023-09-17 ENCOUNTER — Telehealth: Payer: Self-pay | Admitting: Pharmacy Technician

## 2023-09-17 NOTE — Telephone Encounter (Signed)
 I tried to submit the prior authorization and it immediately kicked back saying denied: This medication may be excluded from the patient's benefit. Message from [employer]: Drug is not covered by plan. Key: [id number]

## 2023-09-20 ENCOUNTER — Encounter: Payer: Self-pay | Admitting: Internal Medicine

## 2023-09-20 NOTE — Telephone Encounter (Signed)
 Routed to MD

## 2023-09-20 NOTE — Telephone Encounter (Signed)
 Chrystie Nose, MD  You; Belia Heman, [place], CPhT20 minutes ago (2:17 PM)    Thanks .Marland Kitchen Laural Golden already tried submitting it with the same response. His insurance won't cover the weight loss drugs apparently.  DR H   Update sent to patient via MyChart

## 2023-09-28 ENCOUNTER — Ambulatory Visit (AMBULATORY_SURGERY_CENTER)

## 2023-09-28 VITALS — Ht 73.0 in | Wt 240.0 lb

## 2023-09-28 DIAGNOSIS — Z1211 Encounter for screening for malignant neoplasm of colon: Secondary | ICD-10-CM

## 2023-09-28 MED ORDER — NA SULFATE-K SULFATE-MG SULF 17.5-3.13-1.6 GM/177ML PO SOLN: 1.0000 | Freq: Once | ORAL | 0 refills | Status: AC

## 2023-09-28 NOTE — Progress Notes (Signed)
 No egg or soy allergy known to patient  No issues known to pt with past sedation with any surgeries or procedures Patient denies ever being told they had issues or difficulty with intubation  No FH of Malignant Hyperthermia Pt is not on diet pills Pt is not on  home 02  OSA using CPAP  Pt is not on blood thinners  Pt denies issues with constipation  No A fib or A flutter Have any cardiac testing pending-- no  LOA: independent  Prep: suprep  Patient's chart reviewed by Cathlyn Parsons CNRA prior to previsit and patient appropriate for the [facility].  Previsit completed and red dot placed by patient's name on their procedure day (on provider's schedule).     PV completed with patient. Prep instructions sent via mychart and home address.

## 2023-10-04 ENCOUNTER — Telehealth: Payer: Self-pay | Admitting: Internal Medicine

## 2023-10-04 NOTE — Telephone Encounter (Signed)
 Patient calling in regards to pre visit. Please advise.

## 2023-10-05 NOTE — Telephone Encounter (Signed)
 Called and spoke to patient again at length of his options. Pt reports he has done multiple home fit test and all have come back negative. There is also no family history of any cancers per patient. Pt requesting to proceed with cologuard testing at this time as is it is more cost effective. Pt understands if he has a positive result it is then recommended that he come in for a colonoscopy as this is the best and most accurate way to screen for any colon cancer. Pt is agreeable to colonoscopy in the event his stool test comes back positive. Offered to get orders for cologuard from Dr. Leone Payor. Per pt he will contact his PCP office (Dr. Vincente Poli) and obtain the test from them. If he is unable he will call back. Colonoscopy has been cancelled per pt request.

## 2023-10-05 NOTE — Telephone Encounter (Signed)
 Reviewed Patient preference noted. Further screening tests through PCP.

## 2023-10-05 NOTE — Telephone Encounter (Signed)
 Called and spoke with patient at length, explained the coding process to patient, and explained alternative options for colon screening; patient advised that if he completed colonoscopy and the MD found polyps, it would then be coded as a surveillance, instead of a screening; patient reports he has a high deductible and does not wish to pay this (if polyps are found- patient requesting Dr. Leone Payor to code his procedure as screening , no matter what is found if anything).  RN advised patient that Dr. Leone Payor, if he found any polyps, would by law be required to code his procedure as surveillance;  patient verbally unsatisfied with information RN sent and patient has requested that Annie Sable, [facility] RN call him back to discuss this information with hm; Note forwarded to [facility] per patient's request;

## 2023-10-26 ENCOUNTER — Encounter: Admitting: Internal Medicine

## 2024-02-10 ENCOUNTER — Other Ambulatory Visit: Payer: Self-pay

## 2024-02-10 DIAGNOSIS — E66813 Obesity, class 3: Secondary | ICD-10-CM

## 2024-02-10 DIAGNOSIS — E785 Hyperlipidemia, unspecified: Secondary | ICD-10-CM

## 2024-02-21 ENCOUNTER — Other Ambulatory Visit: Payer: Self-pay | Admitting: Internal Medicine

## 2024-04-19 LAB — NMR, LIPOPROFILE
Cholesterol, Total: 146 mg/dL (ref 100–199)
HDL Particle Number: 38 umol/L (ref >=30.5)
HDL-C: 45 mg/dL (ref >39)
LDL Particle Number: 1146 nmol/L — ABNORMAL HIGH (ref <1000)
LDL Size: 20.2 nm — ABNORMAL LOW (ref >20.5)
LDL-C (NIH Calc): 78 mg/dL (ref 0–99)
LP-IR Score: 59 — ABNORMAL HIGH (ref <=45)
Small LDL Particle Number: 680 nmol/L — ABNORMAL HIGH (ref <=527)
Triglycerides: 129 mg/dL (ref 0–149)

## 2024-04-23 ENCOUNTER — Ambulatory Visit: Payer: Self-pay | Admitting: Internal Medicine

## 2024-05-11 ENCOUNTER — Other Ambulatory Visit: Payer: Self-pay | Admitting: Internal Medicine

## 2024-05-17 ENCOUNTER — Encounter: Payer: Self-pay | Admitting: Internal Medicine

## 2024-05-17 DIAGNOSIS — G4733 Obstructive sleep apnea (adult) (pediatric): Secondary | ICD-10-CM

## 2024-05-17 DIAGNOSIS — E66813 Obesity, class 3: Secondary | ICD-10-CM

## 2024-05-19 NOTE — Addendum Note (Signed)
 Addended by: LORING ANDRIETTE HERO on: 05/19/2024 08:32 AM   Modules accepted: Orders

## 2024-07-11 ENCOUNTER — Encounter: Payer: Self-pay | Admitting: Internal Medicine

## 2024-09-21 ENCOUNTER — Ambulatory Visit: Admitting: Internal Medicine
